# Patient Record
Sex: Female | Born: 1995 | Race: Black or African American | Hispanic: No | Marital: Single | Smoking: Never smoker
Health system: Southern US, Community
[De-identification: ages and names within clinical notes are randomized; demographics above are authoritative.]

## PROBLEM LIST (undated history)

## (undated) DIAGNOSIS — L0291 Cutaneous abscess, unspecified: Secondary | ICD-10-CM

---
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
## 2015-07-30 ENCOUNTER — Emergency Department (HOSPITAL_COMMUNITY)
Admission: EM | Admit: 2015-07-30 | Discharge: 2015-07-30 | Disposition: A | Discharge status: Home / Self Care | Payer: Medicaid Other | Attending: Emergency Medicine | Admitting: Emergency Medicine

## 2015-07-30 ENCOUNTER — Encounter (HOSPITAL_COMMUNITY): Payer: Self-pay | Admitting: Emergency Medicine

## 2015-07-30 DIAGNOSIS — K6289 Other specified diseases of anus and rectum: Secondary | ICD-10-CM

## 2015-07-30 DIAGNOSIS — N939 Abnormal uterine and vaginal bleeding, unspecified: Secondary | ICD-10-CM

## 2015-07-30 DIAGNOSIS — N611 Abscess of the breast and nipple: Secondary | ICD-10-CM | POA: Insufficient documentation

## 2015-07-30 DIAGNOSIS — Z79899 Other long term (current) drug therapy: Secondary | ICD-10-CM | POA: Insufficient documentation

## 2015-07-30 LAB — POC URINE PREG, ED: PREG TEST UR: NEGATIVE

## 2015-07-30 MED ORDER — SULFAMETHOXAZOLE-TRIMETHOPRIM 800-160 MG PO TABS: 1.0000 | ORAL_TABLET | Freq: Two times a day (BID) | ORAL | Status: AC

## 2015-07-30 MED ORDER — SULFAMETHOXAZOLE-TRIMETHOPRIM 800-160 MG PO TABS
1.0000 | ORAL_TABLET | Freq: Once | ORAL | Status: AC
  Administered 2015-07-30: 1 via ORAL
  Filled 2015-07-30: qty 1

## 2015-07-30 MED ORDER — LIDOCAINE-EPINEPHRINE (PF) 2 %-1:200000 IJ SOLN
20.0000 mL | Freq: Once | INTRAMUSCULAR | Status: AC
  Administered 2015-07-30: 20 mL
  Filled 2015-07-30: qty 20

## 2015-07-30 NOTE — ED Provider Notes (Signed)
CSN: [id number]     Arrival date & time 07/30/15  [id number] History   First MD Initiated Contact with Patient 07/30/15 [phone]     Chief Complaint  Patient presents with  . Breast Problem  . Vaginal Bleeding      HPI Patient presents the emergency department today with 3 complaints  First complaint is that she has a history of hidradenitis reports feeling an abscess under her right breast without fevers or chills.  She states this area has enlarged and she is concerned it may need to be drained.  Her second complaint is that she is has small amount of pain with defecation she's concerned that she could have a hemorrhoid.  She denies rectal bleeding.  She reports trying Preparation H without improvement in her symptoms  Her third complaint is that she has an Implanon placed several months ago and continues to have vaginal spotting.  She denies lower abdominal pain or cramping.  No fevers or chills.     Past Medical History  Diagnosis Date  . Abscess    History reviewed. No pertinent past surgical history. History reviewed. No pertinent family history. Social History  Substance Use Topics  . Smoking status: Never Smoker   . Smokeless tobacco: None  . Alcohol Use: No   OB History    No data available     Review of Systems  All other systems reviewed and are negative.     Allergies  Review of patient's allergies indicates no known allergies.  Home Medications   Prior to Admission medications   Medication Sig Start Date End Date Taking? Authorizing Provider  etonogestrel (NEXPLANON) 68 MG IMPL implant 1 each by Subdermal route once. Patient received in April   Yes Historical Provider, MD  ibuprofen (ADVIL,MOTRIN) 200 MG tablet Take 400 mg by mouth every 8 (eight) hours as needed for headache or moderate pain.    Yes Historical Provider, MD  Multiple Vitamins-Minerals (HAIR SKIN NAILS PO) Take 3 tablets by mouth daily.   Yes Historical Provider, MD  minocycline  (MINOCIN,DYNACIN) 100 MG capsule Take 1 capsule (100 mg total) by mouth 2 (two) times daily. Patient not taking: Reported on 07/30/2015 03/09/14   Linna HoffJames D Kindl, MD   BP 119/79 mmHg  Pulse 71  Temp(Src) 99.1 F (37.3 C) (Oral)  Resp 14  Ht 5\' 6"  (1.676 m)  Wt 205 lb (92.987 kg)  BMI 33.10 kg/m2  SpO2 97% Physical Exam  Constitutional: She is oriented to person, place, and time. She appears well-developed and well-nourished.  HENT:  Head: Normocephalic.  Eyes: EOM are normal.  Neck: Normal range of motion.  Cardiovascular: Normal rate.   Pulmonary/Chest: Effort normal.  Small abscess of right lower breast at 6:00 near the anterior chest wall without surrounding erythema.  No drainage.  Definite fluctuance  Abdominal: She exhibits no distension.  Genitourinary:  Normal-appearing rectum.  No obvious hemorrhoids.  Musculoskeletal: Normal range of motion.  Neurological: She is alert and oriented to person, place, and time.  Psychiatric: She has a normal mood and affect.  Nursing note and vitals reviewed.   ED Course  .Marland Kitchen.Incision and Drainage Date/Time: 07/30/2015 11:51 AM Performed by: Azalia BilisAMPOS, Urban [place] Authorized by: Azalia BilisAMPOS, Ericberto Padget    INCISION AND DRAINAGE Performed by: Lyanne CoAMPOS,Piotr Christopher M Consent: Verbal consent obtained. Risks and benefits: risks, benefits and alternatives were discussed Time out performed prior to procedure Type: abscess Body area: right breast Anesthesia: local infiltration Incision was made with a scalpel. Local anesthetic: lidocaine  2% with epinephrine Anesthetic total: 3 ml Complexity: complex Blunt dissection to break up loculations Drainage: purulent Drainage amount: moderate Packing material: none Patient tolerance: Patient tolerated the procedure well with no immediate complications.     Labs Review Labs Reviewed  POC URINE PREG, ED    Imaging Review No results found. I have personally reviewed and evaluated these images and lab results as  part of my medical decision-making.   EKG Interpretation None      MDM   Final diagnoses:  None    Incision and drainage performed without difficulty.  Purulent material removed.  Patient be placed on antibiotics.  In regards to her vaginal spotting outpatient GYN follow-up.  Pregnancy test is negative.  In regards to her discomfort with bowel movement she has no obvious hemorrhoids or bleeding at this time.  Outpatient GI follow-up.    Azalia Bilis, MD 07/30/15 1154

## 2015-07-30 NOTE — ED Notes (Signed)
Pt reports swelling underneath bilateral breasts, groin and between buttocks. Also reports pain with defecation that improved after using preparation H. Pt also began to have spotting after the end of April. Had birth control implant placed in arm at the beginning of April.

## 2017-11-09 ENCOUNTER — Ambulatory Visit (HOSPITAL_COMMUNITY)
Admission: EM | Admit: 2017-11-09 | Discharge: 2017-11-09 | Disposition: A | Discharge status: Home / Self Care | Payer: BC Managed Care – PPO | Attending: Family Medicine | Admitting: Family Medicine

## 2017-11-09 ENCOUNTER — Encounter (HOSPITAL_COMMUNITY): Payer: Self-pay | Admitting: Emergency Medicine

## 2017-11-09 DIAGNOSIS — G44209 Tension-type headache, unspecified, not intractable: Secondary | ICD-10-CM | POA: Diagnosis not present

## 2017-11-09 MED ORDER — DEXAMETHASONE SODIUM PHOSPHATE 10 MG/ML IJ SOLN
10.0000 mg | Freq: Once | INTRAMUSCULAR | Status: AC
  Administered 2017-11-09: 10 mg via INTRAMUSCULAR

## 2017-11-09 MED ORDER — KETOROLAC TROMETHAMINE 60 MG/2ML IM SOLN
60.0000 mg | Freq: Once | INTRAMUSCULAR | Status: AC
  Administered 2017-11-09: 60 mg via INTRAMUSCULAR

## 2017-11-09 MED ORDER — IBUPROFEN 800 MG PO TABS: 800.0000 mg | ORAL_TABLET | Freq: Three times a day (TID) | ORAL | 0 refills | Status: DC

## 2017-11-09 MED ORDER — KETOROLAC TROMETHAMINE 60 MG/2ML IM SOLN
INTRAMUSCULAR | Status: AC
  Filled 2017-11-09: qty 2

## 2017-11-09 MED ORDER — DEXAMETHASONE SODIUM PHOSPHATE 10 MG/ML IJ SOLN
INTRAMUSCULAR | Status: AC
  Filled 2017-11-09: qty 1

## 2017-11-09 NOTE — ED Triage Notes (Signed)
Pt here for frontal HA x 3 days 

## 2017-11-09 NOTE — Discharge Instructions (Signed)
Use anti-inflammatories for pain/swelling. You may take up to 800 mg Ibuprofen every 8 hours with food. You may supplement Ibuprofen with Tylenol [phone] mg every 8 hours.   Please return here go to emergency room if developing worsening headache, persistent headache, developing changes in vision, nausea, vomiting, dizziness, lightheadedness, passing out.

## 2017-11-10 ENCOUNTER — Other Ambulatory Visit: Payer: Self-pay

## 2017-11-10 ENCOUNTER — Emergency Department (HOSPITAL_COMMUNITY)
Admission: EM | Admit: 2017-11-10 | Discharge: 2017-11-10 | Disposition: A | Discharge status: Home / Self Care | Payer: BC Managed Care – PPO | Attending: Emergency Medicine | Admitting: Emergency Medicine

## 2017-11-10 ENCOUNTER — Encounter (HOSPITAL_COMMUNITY): Payer: Self-pay

## 2017-11-10 DIAGNOSIS — Y998 Other external cause status: Secondary | ICD-10-CM | POA: Insufficient documentation

## 2017-11-10 DIAGNOSIS — X58XXXA Exposure to other specified factors, initial encounter: Secondary | ICD-10-CM | POA: Insufficient documentation

## 2017-11-10 DIAGNOSIS — Z79899 Other long term (current) drug therapy: Secondary | ICD-10-CM | POA: Diagnosis not present

## 2017-11-10 DIAGNOSIS — Y9389 Activity, other specified: Secondary | ICD-10-CM | POA: Insufficient documentation

## 2017-11-10 DIAGNOSIS — R519 Headache, unspecified: Secondary | ICD-10-CM

## 2017-11-10 DIAGNOSIS — R51 Headache: Secondary | ICD-10-CM | POA: Insufficient documentation

## 2017-11-10 DIAGNOSIS — Y929 Unspecified place or not applicable: Secondary | ICD-10-CM | POA: Insufficient documentation

## 2017-11-10 DIAGNOSIS — S00451A Superficial foreign body of right ear, initial encounter: Secondary | ICD-10-CM | POA: Diagnosis present

## 2017-11-10 MED ORDER — KETOROLAC TROMETHAMINE 30 MG/ML IJ SOLN
30.0000 mg | Freq: Once | INTRAMUSCULAR | Status: AC
  Administered 2017-11-10: 30 mg via INTRAVENOUS
  Filled 2017-11-10: qty 1

## 2017-11-10 MED ORDER — SODIUM CHLORIDE 0.9 % IV BOLUS
1000.0000 mL | Freq: Once | INTRAVENOUS | Status: AC
  Administered 2017-11-10: 1000 mL via INTRAVENOUS

## 2017-11-10 MED ORDER — DEXAMETHASONE SODIUM PHOSPHATE 10 MG/ML IJ SOLN
10.0000 mg | Freq: Once | INTRAMUSCULAR | Status: AC
  Administered 2017-11-10: 10 mg via INTRAVENOUS
  Filled 2017-11-10: qty 1

## 2017-11-10 MED ORDER — FLUTICASONE PROPIONATE 50 MCG/ACT NA SUSP: 1.0000 | Freq: Every day | NASAL | 2 refills | Status: DC

## 2017-11-10 MED ORDER — AMOXICILLIN-POT CLAVULANATE 875-125 MG PO TABS: 1.0000 | ORAL_TABLET | Freq: Two times a day (BID) | ORAL | 0 refills | Status: DC

## 2017-11-10 MED ORDER — BUTALBITAL-APAP-CAFFEINE 50-325-40 MG PO TABS: 1.0000 | ORAL_TABLET | Freq: Four times a day (QID) | ORAL | 0 refills | Status: DC | PRN

## 2017-11-10 MED ORDER — LORATADINE 10 MG PO TABS: 10.0000 mg | ORAL_TABLET | Freq: Every day | ORAL | 0 refills | Status: DC

## 2017-11-10 MED ORDER — PROCHLORPERAZINE EDISYLATE 10 MG/2ML IJ SOLN
10.0000 mg | Freq: Once | INTRAMUSCULAR | Status: AC
  Administered 2017-11-10: 10 mg via INTRAVENOUS
  Filled 2017-11-10: qty 2

## 2017-11-10 NOTE — ED Provider Notes (Signed)
[facility]    CSN: [id number] Arrival date & time: 11/09/17  1026     History   Chief Complaint Chief Complaint  Patient presents with  . Headache    HPI Melissa Fisher is a 22 y.o. female no contributing past medical history presenting today for evaluation of a headache.  Patient states that for the past 2 to 3 days she has had an elastic band sensation around her head.  Onset gradual.  Headache typically worsens throughout the day.  Feels a pressure sensation especially with looking downward.  Also notes that she has had a toothache in her right lower jaw for the past couple days, but this is eased off today.  Denies issues with this tooth.  Denies vision changes with headache.  Denies associated nausea, vomiting, photophobia, phonophobia, dizziness or lightheadedness.  Denies weakness.  Has been using Aleve with mild relief.  HPI  Past Medical History:  Diagnosis Date  . Abscess     Patient Active Problem List   Diagnosis Date Noted  . Hidradenitis suppurativa of right axilla 04/05/2012    History reviewed. No pertinent surgical history.  OB History   None      Home Medications    Prior to Admission medications   Medication Sig Start Date End Date Taking? Authorizing Provider  etonogestrel (NEXPLANON) 68 MG IMPL implant 1 each by Subdermal route once. Patient received in April    [provider]  ibuprofen (ADVIL,MOTRIN) 800 MG tablet Take 1 tablet (800 mg total) by mouth 3 (three) times daily. 11/09/17   Eulis Salazar C, PA-C  Multiple Vitamins-Minerals (HAIR SKIN NAILS PO) Take 3 tablets by mouth daily.    [provider]    Family History History reviewed. No pertinent family history.  Social History Social History   Tobacco Use  . Smoking status: Never Smoker  Substance Use Topics  . Alcohol use: No  . Drug use: No     Allergies   Patient has no known allergies.   Review of Systems Review of Systems    Constitutional: Negative for fatigue and fever.  HENT: Positive for dental problem. Negative for congestion, sinus pressure and sore throat.   Eyes: Negative for photophobia, pain and visual disturbance.  Respiratory: Negative for cough and shortness of breath.   Cardiovascular: Negative for chest pain.  Gastrointestinal: Negative for abdominal pain, nausea and vomiting.  Genitourinary: Negative for decreased urine volume and hematuria.  Musculoskeletal: Negative for myalgias, neck pain and neck stiffness.  Neurological: Positive for headaches. Negative for dizziness, syncope, facial asymmetry, speech difficulty, weakness, light-headedness and numbness.     Physical Exam Triage Vital Signs ED Triage Vitals [11/09/17 1055]  Enc Vitals Group     BP 126/71     Pulse Rate 81     Resp 18     Temp 98.2 F (36.8 C)     Temp Source Oral     SpO2 100 %     Weight      Height      Head Circumference      Peak Flow      Pain Score      Pain Loc      Pain Edu?      Excl. in GC?    No data found.  Updated Vital Signs BP 126/71 (BP Location: Right Arm)   Pulse 81   Temp 98.2 F (36.8 C) (Oral)   Resp 18   SpO2 100%  Visual Acuity Right Eye Distance:   Left Eye Distance:   Bilateral Distance:    Right Eye Near:   Left Eye Near:    Bilateral Near:     Physical Exam  Constitutional: She is oriented to person, place, and time. She appears well-developed and well-nourished. No distress.  HENT:  Head: Normocephalic and atraumatic.  Mouth/Throat: Oropharynx is clear and moist.  Bilateral ears without tenderness to palpation of external auricle, tragus and mastoid, EAC's without erythema or swelling, TM's with good bony landmarks and cone of light. Non erythematous.  Oral mucosa pink and moist, no tonsillar enlargement or exudate. Posterior pharynx patent and nonerythematous, no uvula deviation or swelling. Normal phonation.  Eyes: Pupils are equal, round, and reactive to  light. Conjunctivae and EOM are normal.  Neck: Normal range of motion. Neck supple.  Cardiovascular: Normal rate and regular rhythm.  No murmur heard. Pulmonary/Chest: Effort normal and breath sounds normal. No respiratory distress.  Breathing comfortably at rest, CTABL, no wheezing, rales or other adventitious sounds auscultated  Abdominal: Soft. There is no tenderness.  Musculoskeletal: She exhibits no edema.  Neurological: She is alert and oriented to person, place, and time.  Patient A&O x3, cranial nerves II-XII grossly intact, strength at shoulders, hips and knees 5/5, equal bilaterally, patellar reflex 2+ bilaterally.  Gait without abnormality.   Skin: Skin is warm and dry.  Psychiatric: She has a normal mood and affect.  Nursing note and vitals reviewed.    UC Treatments / Results  Labs (all labs ordered are listed, but only abnormal results are displayed) Labs Reviewed - No data to display  EKG None  Radiology No results found.  Procedures Procedures (including critical care time)  Medications Ordered in UC Medications  ketorolac (TORADOL) injection 60 mg (60 mg Intramuscular Given 11/09/17 1143)  dexamethasone (DECADRON) injection 10 mg (10 mg Intramuscular Given 11/09/17 1143)    Initial Impression / Assessment and Plan / UC Course  I have reviewed the triage vital signs and the nursing notes.  Pertinent labs & imaging results that were available during my care of the patient were reviewed by me and considered in my medical decision making (see chart for details).     Patient with headache, likely tension type, no focal neuro deficits, vital signs stable, recommended patient to receive Toradol and Decadron, will send home with ibuprofen 800 for further management of headache at home along with taking Tylenol.Discussed strict return precautions. Patient verbalized understanding and is agreeable with plan.  Final Clinical Impressions(s) / UC Diagnoses   Final  diagnoses:  Acute non intractable tension-type headache     Discharge Instructions     Use anti-inflammatories for pain/swelling. You may take up to 800 mg Ibuprofen every 8 hours with food. You may supplement Ibuprofen with Tylenol [phone] mg every 8 hours.   Please return here go to emergency room if developing worsening headache, persistent headache, developing changes in vision, nausea, vomiting, dizziness, lightheadedness, passing out.   ED Prescriptions    Medication Sig Dispense Auth. Provider   ibuprofen (ADVIL,MOTRIN) 800 MG tablet Take 1 tablet (800 mg total) by mouth 3 (three) times daily. 21 tablet Deshunda Thackston, [place] C, PA-C     Controlled Substance Prescriptions [place] Controlled Substance Registry consulted? Not Applicable   Lew Dawes, [place] 11/10/17 7371

## 2017-11-10 NOTE — ED Notes (Signed)
Patient verbalizes understanding of discharge instructions. Opportunity for questioning and answers were provided. Armband removed by staff, pt discharged from ED.  

## 2017-11-10 NOTE — ED Provider Notes (Addendum)
[facility] EMERGENCY DEPARTMENT Provider Note   CSN: [id number] Arrival date & time: 11/10/17  1327     History   Chief Complaint Chief Complaint  Patient presents with  . Headache    HPI   Blood pressure 124/72, pulse 72, temperature 99.2 F (37.3 C), temperature source Oral, resp. rate 17, height 5\' 6"  (1.676 m), weight 93 kg, last menstrual period 11/04/2017, SpO2 100 %.  Melissa Fisher is a 22 y.o. female complaining of intermittent bilateral frontal headache onset 4 days ago she does not typically have headaches exacerbated by loud sounds and light.  She was seen at the urgent care yesterday she got 2 shots with little relief.  There is no associated fever, chills, neck stiffness, nausea vomiting, change in vision. Pt denies fever, rash, confusion, cervicalgia, LOC/syncope, change in vision, N/V, numbness, weakness, dysarthria, ataxia, thunderclap onset, exacerbation with exertion or valsalva, exacerbation in morning, CP, SOB, abdominal pain.   Past Medical History:  Diagnosis Date  . Abscess     Patient Active Problem List   Diagnosis Date Noted  . Hidradenitis suppurativa of right axilla 04/05/2012    History reviewed. No pertinent surgical history.   OB History   None      Home Medications    Prior to Admission medications   Medication Sig Start Date End Date Taking? Authorizing Provider  amoxicillin-clavulanate (AUGMENTIN) 875-125 MG tablet Take 1 tablet by mouth every 12 (twelve) hours. 11/10/17   Genia Perin, Joni Reining, PA-C  butalbital-acetaminophen-caffeine (FIORICET, ESGIC) [phone] MG tablet Take 1 tablet by mouth every 6 (six) hours as needed for headache. 11/10/17   Demarius Archila, Joni Reining, PA-C  etonogestrel (NEXPLANON) 68 MG IMPL implant 1 each by Subdermal route once. Patient received in April    [provider]  fluticasone [facility]) 50 MCG/ACT nasal spray Place 1 spray into both nostrils daily. 11/10/17   Citlally Captain, Joni Reining, PA-C    ibuprofen (ADVIL,MOTRIN) 800 MG tablet Take 1 tablet (800 mg total) by mouth 3 (three) times daily. 11/09/17   Wieters, Hallie C, PA-C  loratadine (CLARITIN) 10 MG tablet Take 1 tablet (10 mg total) by mouth daily. One po daily x 5 days 11/10/17   Latishia Suitt, Joni Reining, PA-C  Multiple Vitamins-Minerals (HAIR SKIN NAILS PO) Take 3 tablets by mouth daily.    [provider]    Family History No family history on file.  Social History Social History   Tobacco Use  . Smoking status: Never Smoker  Substance Use Topics  . Alcohol use: No  . Drug use: No     Allergies   Patient has no known allergies.   Review of Systems Review of Systems   A complete review of systems was obtained and all systems are negative except as noted in the HPI and PMH.    Physical Exam Updated Vital Signs BP 102/76   Pulse 67   Temp 99.2 F (37.3 C) (Oral)   Resp 17   Ht 5\' 6"  (1.676 m)   Wt 93 kg   LMP 11/04/2017 (Exact Date)   SpO2 100%   BMI 33.09 kg/m   Physical Exam  Constitutional: She is oriented to person, place, and time. She appears well-developed and well-nourished.  HENT:  Head: Normocephalic and atraumatic.  Mouth/Throat: Oropharynx is clear and moist.  Right ear canal with foreign body  Eyes: Pupils are equal, round, and reactive to light. Conjunctivae and EOM are normal.  No TTP of maxillary or frontal sinuses  No TTP  or induration of temporal arteries bilaterally  Neck: Normal range of motion. Neck supple.  FROM to C-spine. Pt can touch chin to chest without discomfort. No TTP of midline cervical spine.   Cardiovascular: Normal rate, regular rhythm and intact distal pulses.  Pulmonary/Chest: Effort normal and breath sounds normal. No respiratory distress. She has no wheezes. She has no rales. She exhibits no tenderness.  Abdominal: Soft. Bowel sounds are normal. There is no tenderness.  Musculoskeletal: Normal range of motion. She exhibits no edema or tenderness.   Neurological: She is alert and oriented to person, place, and time. No cranial nerve deficit.  II-Visual fields grossly intact. III/IV/VI-Extraocular movements intact.  Pupils reactive bilaterally. V/VII-Smile symmetric, equal eyebrow raise,  facial sensation intact VIII- Hearing grossly intact IX/X-Normal gag XI-bilateral shoulder shrug XII-midline tongue extension Motor: 5/5 bilaterally with normal tone and bulk Cerebellar: Normal finger-to-nose  and normal heel-to-shin test.   Romberg negative Ambulates with a coordinated gait   Nursing note and vitals reviewed.    ED Treatments / Results  Labs (all labs ordered are listed, but only abnormal results are displayed) Labs Reviewed - No data to display  EKG None  Radiology No results found.  Procedures .Foreign Body Removal Date/Time: 11/10/2017 3:03 PM Performed by: Wynetta Emery, PA-C Authorized by: Wynetta Emery, PA-C  Consent: Verbal consent obtained. Consent given by: patient Patient understanding: patient does not state understanding of the procedure being performed Body area: ear  Sedation: Patient sedated: no  Localization method: probed Removal mechanism: irrigation and alligator forceps Complexity: simple 1 objects recovered. Objects recovered: q-tip cotton Post-procedure assessment: foreign body removed Patient tolerance: Patient tolerated the procedure well with no immediate complications   (including critical care time)  Medications Ordered in ED Medications  sodium chloride 0.9 % bolus 1,000 mL (1,000 mLs Intravenous New Bag/Given 11/10/17 1521)  prochlorperazine (COMPAZINE) injection 10 mg (10 mg Intravenous Given 11/10/17 1520)  dexamethasone (DECADRON) injection 10 mg (10 mg Intravenous Given 11/10/17 1520)  ketorolac (TORADOL) 30 MG/ML injection 30 mg (30 mg Intravenous Given 11/10/17 1520)     Initial Impression / Assessment and Plan / ED Course  I have reviewed the triage vital signs and  the nursing notes.  Pertinent labs & imaging results that were available during my care of the patient were reviewed by me and considered in my medical decision making (see chart for details).     Vitals:   11/10/17 1415 11/10/17 1430 11/10/17 1445 11/10/17 1500  BP: (!) 88/78 115/70 109/74 102/76  Pulse: 68 73 75 67  Resp:      Temp:      TempSrc:      SpO2: 100% 100% 100% 100%  Weight:      Height:        Medications  sodium chloride 0.9 % bolus 1,000 mL (1,000 mLs Intravenous New Bag/Given 11/10/17 1521)  prochlorperazine (COMPAZINE) injection 10 mg (10 mg Intravenous Given 11/10/17 1520)  dexamethasone (DECADRON) injection 10 mg (10 mg Intravenous Given 11/10/17 1520)  ketorolac (TORADOL) 30 MG/ML injection 30 mg (30 mg Intravenous Given 11/10/17 1520)    Melissa Fisher is 22 y.o. female presenting with persistent frontal headache over the course of the last 4 days, she states is worse leaning forward.  She was seen in urgent care yesterday given 2 shots without relief.  She also notes that she has a foreign body in the right outer ear canal.  This is not been bothering her.  She does use  Q-tips in the ear.  In shared decision making we have discussed the pros and cons of obtaining CAT scan imaging.  I do not think this is warranted at this time given her reassuring neurologic exam, history with no red flags and 4 days of headache.  I think this might be a sinus infection versus tension headache.  I think is reasonable to start on a course of antibiotics and then reassess for imaging, she understands she can return to ED at any time, foreign body was removed from right ear without complication.  Advised her not to use Q-tips in the outer ear canal.  Proved with headache cocktail, will treat for sinusitis, advised her to return for any worsening symptoms, get eye exam and establish primary care.  Evaluation does not show pathology that would require ongoing emergent intervention or  inpatient treatment. Pt is hemodynamically stable and mentating appropriately. Discussed findings and plan with patient/guardian, who agrees with care plan. All questions answered. Return precautions discussed and outpatient follow up given.    Final Clinical Impressions(s) / ED Diagnoses   Final diagnoses:  Foreign body of external ear, right, initial encounter  Frontal headache    ED Discharge Orders         Ordered    amoxicillin-clavulanate (AUGMENTIN) 875-125 MG tablet  Every 12 hours     11/10/17 1500    butalbital-acetaminophen-caffeine (FIORICET, ESGIC) 50-325-40 MG tablet  Every 6 hours PRN     11/10/17 1500    fluticasone (FLONASE) 50 MCG/ACT nasal spray  Daily     11/10/17 1605    loratadine (CLARITIN) 10 MG tablet  Daily     11/10/17 1605           Anaria Kroner, Mardella Layman 11/10/17 1503    Quince Santana, [place], PA-C 11/10/17 1607    Tilden Fossa, MD 11/11/17 [phone]

## 2017-11-10 NOTE — ED Triage Notes (Signed)
Patient prevents for evaluation of ongoing headache x 4 days. States was seen at [facility] yesterday for same, given two injections for pain. States neither helped. Pain is constant, describes as band around head. Patient states she was told at [facility] that she had a piece of cotton in R ear but they did not have tools to remove.

## 2017-11-10 NOTE — Discharge Instructions (Signed)
Please follow with your primary care doctor in the next 2 days for a check-up. They must obtain records for further management.  ° °Do not hesitate to return to the Emergency Department for any new, worsening or concerning symptoms.  ° °

## 2019-01-24 ENCOUNTER — Other Ambulatory Visit: Payer: Self-pay

## 2019-01-24 DIAGNOSIS — Z20822 Contact with and (suspected) exposure to covid-19: Secondary | ICD-10-CM

## 2019-01-27 LAB — NOVEL CORONAVIRUS, NAA: SARS-CoV-2, NAA: NOT DETECTED

## 2019-03-17 ENCOUNTER — Encounter: Payer: Self-pay | Admitting: Family Medicine

## 2019-03-17 ENCOUNTER — Ambulatory Visit (INDEPENDENT_AMBULATORY_CARE_PROVIDER_SITE_OTHER): Payer: 59 | Admitting: Family Medicine

## 2019-03-17 ENCOUNTER — Other Ambulatory Visit: Payer: Self-pay

## 2019-03-17 VITALS — BP 118/80 | HR 92 | Temp 99.2°F | Ht 65.0 in | Wt 209.8 lb

## 2019-03-17 DIAGNOSIS — Z23 Encounter for immunization: Secondary | ICD-10-CM | POA: Diagnosis not present

## 2019-03-17 DIAGNOSIS — Z0000 Encounter for general adult medical examination without abnormal findings: Secondary | ICD-10-CM

## 2019-03-17 NOTE — Patient Instructions (Signed)
Health Maintenance, Female Adopting a healthy lifestyle and getting preventive care are important in promoting health and wellness. Ask your health care provider about:  The right schedule for you to have regular tests and exams.  Things you can do on your own to prevent diseases and keep yourself healthy. What should I know about diet, weight, and exercise? Eat a healthy diet   Eat a diet that includes plenty of vegetables, fruits, low-fat dairy products, and lean protein.  Do not eat a lot of foods that are high in solid fats, added sugars, or sodium. Maintain a healthy weight Body mass index (BMI) is used to identify weight problems. It estimates body fat based on height and weight. Your health care provider can help determine your BMI and help you achieve or maintain a healthy weight. Get regular exercise Get regular exercise. This is one of the most important things you can do for your health. Most adults should:  Exercise for at least 150 minutes each week. The exercise should increase your heart rate and make you sweat (moderate-intensity exercise).  Do strengthening exercises at least twice a week. This is in addition to the moderate-intensity exercise.  Spend less time sitting. Even light physical activity can be beneficial. Watch cholesterol and blood lipids Have your blood tested for lipids and cholesterol at 24 years of age, then have this test every 5 years. Have your cholesterol levels checked more often if:  Your lipid or cholesterol levels are high.  You are older than 24 years of age.  You are at high risk for heart disease. What should I know about cancer screening? Depending on your health history and family history, you may need to have cancer screening at various ages. This may include screening for:  Breast cancer.  Cervical cancer.  Colorectal cancer.  Skin cancer.  Lung cancer. What should I know about heart disease, diabetes, and high blood  pressure? Blood pressure and heart disease  High blood pressure causes heart disease and increases the risk of stroke. This is more likely to develop in people who have high blood pressure readings, are of African descent, or are overweight.  Have your blood pressure checked: ? Every 3-5 years if you are 54-62 years of age. ? Every year if you are 93 years old or older. Diabetes Have regular diabetes screenings. This checks your fasting blood sugar level. Have the screening done:  Once every three years after age 69 if you are at a normal weight and have a low risk for diabetes.  More often and at a younger age if you are overweight or have a high risk for diabetes. What should I know about preventing infection? Hepatitis B If you have a higher risk for hepatitis B, you should be screened for this virus. Talk with your health care provider to find out if you are at risk for hepatitis B infection. Hepatitis C Testing is recommended for:  Everyone born from [birth year] through 1965.  Anyone with known risk factors for hepatitis C. Sexually transmitted infections (STIs)  Get screened for STIs, including gonorrhea and chlamydia, if: ? You are sexually active and are younger than 24 years of age. ? You are older than 24 years of age and your health care provider tells you that you are at risk for this type of infection. ? Your sexual activity has changed since you were last screened, and you are at increased risk for chlamydia or gonorrhea. Ask your health care provider if  you are at risk.  Ask your health care provider about whether you are at high risk for HIV. Your health care provider may recommend a prescription medicine to help prevent HIV infection. If you choose to take medicine to prevent HIV, you should first get tested for HIV. You should then be tested every 3 months for as long as you are taking the medicine. Pregnancy  If you are about to stop having your period (premenopausal) and  you may become pregnant, seek counseling before you get pregnant.  Take 400 to 800 micrograms (mcg) of folic acid every day if you become pregnant.  Ask for birth control (contraception) if you want to prevent pregnancy. Osteoporosis and menopause Osteoporosis is a disease in which the bones lose minerals and strength with aging. This can result in bone fractures. If you are 9 years old or older, or if you are at risk for osteoporosis and fractures, ask your health care provider if you should:  Be screened for bone loss.  Take a calcium or vitamin D supplement to lower your risk of fractures.  Be given hormone replacement therapy (HRT) to treat symptoms of menopause. Follow these instructions at home: Lifestyle  Do not use any products that contain nicotine or tobacco, such as cigarettes, e-cigarettes, and chewing tobacco. If you need help quitting, ask your health care provider.  Do not use street drugs.  Do not share needles.  Ask your health care provider for help if you need support or information about quitting drugs. Alcohol use  Do not drink alcohol if: ? Your health care provider tells you not to drink. ? You are pregnant, may be pregnant, or are planning to become pregnant.  If you drink alcohol: ? Limit how much you use to 0-1 drink a day. ? Limit intake if you are breastfeeding.  Be aware of how much alcohol is in your drink. In the [place] one drink equals one 12 oz bottle of beer (355 mL), one 5 oz glass of wine (148 mL), or one 1 oz glass of hard liquor (44 mL). General instructions  Schedule regular health, dental, and eye exams.  Stay current with your vaccines.  Tell your health care provider if: ? You often feel depressed. ? You have ever been abused or do not feel safe at home. Summary  Adopting a healthy lifestyle and getting preventive care are important in promoting health and wellness.  Follow your health care provider's instructions about healthy  diet, exercising, and getting tested or screened for diseases.  Follow your health care provider's instructions on monitoring your cholesterol and blood pressure. This information is not intended to replace advice given to you by your health care provider. Make sure you discuss any questions you have with your health care provider. Document Revised: 02/13/2018 Document Reviewed: 02/13/2018 Elsevier Patient Education  2020 [place] 93-24 Years Old, Female Preventive care refers to visits with your health care provider and lifestyle choices that can promote health and wellness. This includes:  A yearly physical exam. This may also be called an annual well check.  Regular dental visits and eye exams.  Immunizations.  Screening for certain conditions.  Healthy lifestyle choices, such as eating a healthy diet, getting regular exercise, not using drugs or products that contain nicotine and tobacco, and limiting alcohol use. What can I expect for my preventive care visit? Physical exam Your health care provider will check your:  Height and weight. This may be used  to calculate body mass index (BMI), which tells if you are at a healthy weight.  Heart rate and blood pressure.  Skin for abnormal spots. Counseling Your health care provider may ask you questions about your:  Alcohol, tobacco, and drug use.  Emotional well-being.  Home and relationship well-being.  Sexual activity.  Eating habits.  Work and work Statistician.  Method of birth control.  Menstrual cycle.  Pregnancy history. What immunizations do I need?  Influenza (flu) vaccine  This is recommended every year. Tetanus, diphtheria, and pertussis (Tdap) vaccine  You may need a Td booster every 10 years. Varicella (chickenpox) vaccine  You may need this if you have not been vaccinated. Human papillomavirus (HPV) vaccine  If recommended by your health care provider, you may need three  doses over 6 months. Measles, mumps, and rubella (MMR) vaccine  You may need at least one dose of MMR. You may also need a second dose. Meningococcal conjugate (MenACWY) vaccine  One dose is recommended if you are age 46-21 years and a first-year college student living in a residence hall, or if you have one of several medical conditions. You may also need additional booster doses. Pneumococcal conjugate (PCV13) vaccine  You may need this if you have certain conditions and were not previously vaccinated. Pneumococcal polysaccharide (PPSV23) vaccine  You may need one or two doses if you smoke cigarettes or if you have certain conditions. Hepatitis A vaccine  You may need this if you have certain conditions or if you travel or work in places where you may be exposed to hepatitis A. Hepatitis B vaccine  You may need this if you have certain conditions or if you travel or work in places where you may be exposed to hepatitis B. Haemophilus influenzae type b (Hib) vaccine  You may need this if you have certain conditions. You may receive vaccines as individual doses or as more than one vaccine together in one shot (combination vaccines). Talk with your health care provider about the risks and benefits of combination vaccines. What tests do I need?  Blood tests  Lipid and cholesterol levels. These may be checked every 5 years starting at age 12.  Hepatitis C test.  Hepatitis B test. Screening  Diabetes screening. This is done by checking your blood sugar (glucose) after you have not eaten for a while (fasting).  Sexually transmitted disease (STD) testing.  BRCA-related cancer screening. This may be done if you have a family history of breast, ovarian, tubal, or peritoneal cancers.  Pelvic exam and Pap test. This may be done every 3 years starting at age 28. Starting at age 37, this may be done every 5 years if you have a Pap test in combination with an HPV test. Talk with your  health care provider about your test results, treatment options, and if necessary, the need for more tests. Follow these instructions at home: Eating and drinking   Eat a diet that includes fresh fruits and vegetables, whole grains, lean protein, and low-fat dairy.  Take vitamin and mineral supplements as recommended by your health care provider.  Do not drink alcohol if: ? Your health care provider tells you not to drink. ? You are pregnant, may be pregnant, or are planning to become pregnant.  If you drink alcohol: ? Limit how much you have to 0-1 drink a day. ? Be aware of how much alcohol is in your drink. In the [place] one drink equals one 12 oz bottle of beer (  355 mL), one 5 oz glass of wine (148 mL), or one 1 oz glass of hard liquor (44 mL). Lifestyle  Take daily care of your teeth and gums.  Stay active. Exercise for at least 30 minutes on 5 or more days each week.  Do not use any products that contain nicotine or tobacco, such as cigarettes, e-cigarettes, and chewing tobacco. If you need help quitting, ask your health care provider.  If you are sexually active, practice safe sex. Use a condom or other form of birth control (contraception) in order to prevent pregnancy and STIs (sexually transmitted infections). If you plan to become pregnant, see your health care provider for a preconception visit. What's next?  Visit your health care provider once a year for a well check visit.  Ask your health care provider how often you should have your eyes and teeth checked.  Stay up to date on all vaccines. This information is not intended to replace advice given to you by your health care provider. Make sure you discuss any questions you have with your health care provider. Document Revised: 11/01/2017 Document Reviewed: 11/01/2017 Elsevier Patient Education  2020 [employer] Inc[employer]  

## 2019-03-17 NOTE — Progress Notes (Signed)
New Patient Office Visit  Subjective:  Patient ID: Melissa Fisher, female    DOB: 03-04-1996  Age: 24 y.o. MRN: [id number]  CC:  Chief Complaint  Patient presents with  . Establish Care    CPE, no concerns     HPI Melissa Fisher presents for establishment of care and a physical exam.  She enjoys good health.  She exercises daily for 30 minutes.  She does not drink alcohol use illicit drugs or smoke.  She is currently working from home.  Dental care is planned for the near future.  Her dad is in his 34s and suffers from hypertension, early dementia and arthritis.  Her mom is 28 and is dealing with diabetes.  Patient is in search of a GYN provider.  She is currently using no form of contraception.  She has not had the Gardasil vaccination.  She has not had a Pap smear.  Past Medical History:  Diagnosis Date  . Abscess   . Hidradenitis suppurativa 2008    History reviewed. No pertinent surgical history.  Family History  Problem Relation Age of Onset  . Diabetes Mother   . Hypertension Father     Social History   Socioeconomic History  . Marital status: Single    Spouse name: Not on file  . Number of children: Not on file  . Years of education: Not on file  . Highest education level: Not on file  Occupational History  . Not on file  Tobacco Use  . Smoking status: Never Smoker  . Smokeless tobacco: Never Used  Substance and Sexual Activity  . Alcohol use: No  . Drug use: Not Currently    Types: Marijuana  . Sexual activity: Not Currently  Other Topics Concern  . Not on file  Social History Narrative  . Not on file   Social Determinants of Health   Financial Resource Strain:   . Difficulty of Paying Living Expenses: Not on file  Food Insecurity:   . Worried About Charity fundraiser in the Last Year: Not on file  . Ran Out of Food in the Last Year: Not on file  Transportation Needs:   . Lack of Transportation (Medical): Not on file  . Lack of  Transportation (Non-Medical): Not on file  Physical Activity:   . Days of Exercise per Week: Not on file  . Minutes of Exercise per Session: Not on file  Stress:   . Feeling of Stress : Not on file  Social Connections:   . Frequency of Communication with Friends and Family: Not on file  . Frequency of Social Gatherings with Friends and Family: Not on file  . Attends Religious Services: Not on file  . Active Member of Clubs or Organizations: Not on file  . Attends Archivist Meetings: Not on file  . Marital Status: Not on file  Intimate Partner Violence:   . Fear of Current or Ex-Partner: Not on file  . Emotionally Abused: Not on file  . Physically Abused: Not on file  . Sexually Abused: Not on file    ROS Review of Systems  Constitutional: Negative.   HENT: Negative.   Eyes: Negative.   Respiratory: Negative.   Cardiovascular: Negative.   Gastrointestinal: Negative.   Genitourinary: Negative.   Musculoskeletal: Negative.   Skin: Negative for pallor and rash.  Neurological: Negative.   Hematological: Negative.   Psychiatric/Behavioral: Negative.     Objective:   Today's Vitals: BP 118/80  Pulse 92   Temp 99.2 F (37.3 C) (Oral)   Ht 5' 5"  (1.651 m)   Wt 209 lb 12.8 oz (95.2 kg)   LMP 03/10/2019 (Approximate)   SpO2 100%   BMI 34.91 kg/m   Physical Exam Constitutional:      General: She is not in acute distress.    Appearance: Normal appearance. She is not ill-appearing, toxic-appearing or diaphoretic.  HENT:     Head: Normocephalic and atraumatic.     Right Ear: Tympanic membrane, ear canal and external ear normal. There is no impacted cerumen.     Left Ear: Tympanic membrane, ear canal and external ear normal. There is no impacted cerumen.     Nose: No congestion or rhinorrhea.     Mouth/Throat:     Mouth: Mucous membranes are moist.     Pharynx: Oropharynx is clear. No oropharyngeal exudate or posterior oropharyngeal erythema.  Eyes:      General: No scleral icterus.       Right eye: No discharge.        Left eye: No discharge.     Extraocular Movements: Extraocular movements intact.     Conjunctiva/sclera: Conjunctivae normal.     Pupils: Pupils are equal, round, and reactive to light.  Cardiovascular:     Rate and Rhythm: Normal rate and regular rhythm.     Heart sounds: Normal heart sounds.  Pulmonary:     Effort: Pulmonary effort is normal.     Breath sounds: Normal breath sounds.  Abdominal:     General: Bowel sounds are normal.  Musculoskeletal:        General: No swelling, tenderness, deformity or signs of injury. Normal range of motion.     Cervical back: No rigidity or tenderness.     Right lower leg: No edema.  Lymphadenopathy:     Cervical: No cervical adenopathy.  Skin:    General: Skin is dry.  Neurological:     Mental Status: She is alert and oriented to person, place, and time.     Motor: No weakness.  Psychiatric:        Mood and Affect: Mood normal.        Behavior: Behavior normal.     Assessment & Plan:   Problem List Items Addressed This Visit    None    Visit Diagnoses    Healthcare maintenance    -  Primary   Relevant Orders   CBC   Comp Met (CMET)   Lipid Profile   Urinalysis, Routine w reflex microscopic   Need for Tdap vaccination       Relevant Orders   Tdap vaccine greater than or equal to 7yo IM (Completed)   Ambulatory referral to Gynecology      Outpatient Encounter Medications as of 03/17/2019  Medication Sig  . Multiple Vitamins-Minerals (HAIR SKIN NAILS PO) Take 3 tablets by mouth daily.  Marland Kitchen amoxicillin-clavulanate (AUGMENTIN) 875-125 MG tablet Take 1 tablet by mouth every 12 (twelve) hours. (Patient not taking: Reported on 03/17/2019)  . butalbital-acetaminophen-caffeine (FIORICET, ESGIC) 50-325-40 MG tablet Take 1 tablet by mouth every 6 (six) hours as needed for headache. (Patient not taking: Reported on 03/17/2019)  . etonogestrel (NEXPLANON) 68 MG IMPL implant 1  each by Subdermal route once. Patient received in April  . fluticasone (FLONASE) 50 MCG/ACT nasal spray Place 1 spray into both nostrils daily. (Patient not taking: Reported on 03/17/2019)  . ibuprofen (ADVIL,MOTRIN) 800 MG tablet Take 1 tablet (800 mg total)  by mouth 3 (three) times daily. (Patient not taking: Reported on 03/17/2019)  . loratadine (CLARITIN) 10 MG tablet Take 1 tablet (10 mg total) by mouth daily. One po daily x 5 days (Patient not taking: Reported on 03/17/2019)   No facility-administered encounter medications on file as of 03/17/2019.    Follow-up: Return in about 1 year (around 03/16/2020), or please return fasting for todays blood work..  Patient was given information on health maintenance and disease prevention.  Encouraged her to maintain her healthy lifestyle.  She will follow-up with GYN for women's health, contraception and for the Gardasil vaccine. Libby Maw, MD

## 2019-03-18 ENCOUNTER — Other Ambulatory Visit (INDEPENDENT_AMBULATORY_CARE_PROVIDER_SITE_OTHER): Payer: 59

## 2019-03-18 DIAGNOSIS — Z0000 Encounter for general adult medical examination without abnormal findings: Secondary | ICD-10-CM | POA: Diagnosis not present

## 2019-03-18 LAB — COMPREHENSIVE METABOLIC PANEL
ALT: 10 U/L (ref 0–35)
AST: 15 U/L (ref 0–37)
Albumin: 4.3 g/dL (ref 3.5–5.2)
Alkaline Phosphatase: 53 U/L (ref 39–117)
BUN: 16 mg/dL (ref 6–23)
CO2: 27 mEq/L (ref 19–32)
Calcium: 9.4 mg/dL (ref 8.4–10.5)
Chloride: 103 mEq/L (ref 96–112)
Creatinine, Ser: 0.88 mg/dL (ref 0.40–1.20)
GFR: 96.08 mL/min (ref >60.00)
Glucose, Bld: 91 mg/dL (ref 70–99)
Potassium: 3.6 mEq/L (ref 3.5–5.1)
Sodium: 136 mEq/L (ref 135–145)
Total Bilirubin: 0.8 mg/dL (ref 0.2–1.2)
Total Protein: 7.1 g/dL (ref 6.0–8.3)

## 2019-03-18 LAB — URINALYSIS, ROUTINE W REFLEX MICROSCOPIC
Bilirubin Urine: NEGATIVE
Hgb urine dipstick: NEGATIVE
Ketones, ur: NEGATIVE
Leukocytes,Ua: NEGATIVE
Nitrite: NEGATIVE
RBC / HPF: NONE SEEN (ref 0–?)
Specific Gravity, Urine: 1.02 (ref 1.000–1.030)
Total Protein, Urine: NEGATIVE
Urine Glucose: NEGATIVE
Urobilinogen, UA: 0.2 (ref 0.0–1.0)
pH: 6 (ref 5.0–8.0)

## 2019-03-18 LAB — CBC
HCT: 38.7 % (ref 36.0–46.0)
Hemoglobin: 13.5 g/dL (ref 12.0–15.0)
MCHC: 34.9 g/dL (ref 30.0–36.0)
MCV: 94.1 fl (ref 78.0–100.0)
Platelets: 347 10*3/uL (ref 150.0–400.0)
RBC: 4.12 Mil/uL (ref 3.87–5.11)
RDW: 12.2 % (ref 11.5–15.5)
WBC: 5.1 10*3/uL (ref 4.0–10.5)

## 2019-03-18 LAB — LIPID PANEL
Cholesterol: 155 mg/dL (ref 0–200)
HDL: 45.3 mg/dL (ref >39.00)
LDL Cholesterol: 100 mg/dL — ABNORMAL HIGH (ref 0–99)
NonHDL: 110.08
Total CHOL/HDL Ratio: 3
Triglycerides: 49 mg/dL (ref 0.0–149.0)
VLDL: 9.8 mg/dL (ref 0.0–40.0)

## 2019-04-09 ENCOUNTER — Encounter: Payer: Self-pay | Admitting: Family Medicine

## 2019-04-09 ENCOUNTER — Telehealth (INDEPENDENT_AMBULATORY_CARE_PROVIDER_SITE_OTHER): Payer: 59 | Admitting: Family Medicine

## 2019-04-09 VITALS — Ht 65.0 in | Wt 204.0 lb

## 2019-04-09 DIAGNOSIS — S0300XA Dislocation of jaw, unspecified side, initial encounter: Secondary | ICD-10-CM

## 2019-04-09 MED ORDER — CYCLOBENZAPRINE HCL 10 MG PO TABS: 10.0000 mg | ORAL_TABLET | Freq: Every day | ORAL | 0 refills | Status: DC

## 2019-04-09 MED ORDER — MELOXICAM 7.5 MG PO TABS: 7.5000 mg | ORAL_TABLET | Freq: Every day | ORAL | 0 refills | Status: DC

## 2019-04-09 NOTE — Progress Notes (Signed)
New Patient Office Visit  Subjective:  Patient ID: Melissa Fisher, female    DOB: 07-24-1995  Age: 24 y.o. MRN: [id number]  CC:  Chief Complaint  Patient presents with  . Pain    pain on left side of jaw and ear x 1 week becoming worse x 3 days    HPI Melissa Fisher presents for a 3-day history of pain in the left side of her face just in front of her ear.  Worse when she opens her mouth or yawns.  Worse when she bites down on chewing food.  Admits to some jaw clenching in the evening but not so much during the day.  Has not had access to regular dental care recently.  Plan follow-up with GYN in February.  Past Medical History:  Diagnosis Date  . Abscess   . Hidradenitis suppurativa 2008    History reviewed. No pertinent surgical history.  Family History  Problem Relation Age of Onset  . Diabetes Mother   . Hypertension Father     Social History   Socioeconomic History  . Marital status: Single    Spouse name: Not on file  . Number of children: Not on file  . Years of education: Not on file  . Highest education level: Not on file  Occupational History  . Not on file  Tobacco Use  . Smoking status: Never Smoker  . Smokeless tobacco: Never Used  Substance and Sexual Activity  . Alcohol use: No  . Drug use: Not Currently    Types: Marijuana  . Sexual activity: Not Currently  Other Topics Concern  . Not on file  Social History Narrative  . Not on file   Social Determinants of Health   Financial Resource Strain:   . Difficulty of Paying Living Expenses: Not on file  Food Insecurity:   . Worried About Charity fundraiser in the Last Year: Not on file  . Ran Out of Food in the Last Year: Not on file  Transportation Needs:   . Lack of Transportation (Medical): Not on file  . Lack of Transportation (Non-Medical): Not on file  Physical Activity:   . Days of Exercise per Week: Not on file  . Minutes of Exercise per Session: Not on file  Stress:   . Feeling  of Stress : Not on file  Social Connections:   . Frequency of Communication with Friends and Family: Not on file  . Frequency of Social Gatherings with Friends and Family: Not on file  . Attends Religious Services: Not on file  . Active Member of Clubs or Organizations: Not on file  . Attends Archivist Meetings: Not on file  . Marital Status: Not on file  Intimate Partner Violence:   . Fear of Current or Ex-Partner: Not on file  . Emotionally Abused: Not on file  . Physically Abused: Not on file  . Sexually Abused: Not on file    ROS Review of Systems  Constitutional: Negative.   HENT: Negative for congestion, dental problem, ear pain and postnasal drip.   Respiratory: Negative.   Cardiovascular: Negative.   Gastrointestinal: Negative.   Musculoskeletal: Positive for arthralgias.    Objective:   Today's Vitals: Ht 5\' 5"  (1.651 m)   Wt 204 lb (92.5 kg) Comment: per pt  LMP 03/10/2019 (Approximate)   BMI 33.95 kg/m   Physical Exam Vitals and nursing note reviewed.  Constitutional:      General: She is not in  acute distress.    Appearance: Normal appearance. She is normal weight. She is not ill-appearing, toxic-appearing or diaphoretic.  HENT:     Head: Normocephalic and atraumatic.     Right Ear: External ear normal.     Left Ear: External ear normal.  Eyes:     General: No scleral icterus.       Right eye: No discharge.        Left eye: No discharge.     Conjunctiva/sclera: Conjunctivae normal.  Pulmonary:     Effort: Pulmonary effort is normal.  Neurological:     Mental Status: She is alert and oriented to person, place, and time.  Psychiatric:        Mood and Affect: Mood normal.        Behavior: Behavior normal.     Assessment & Plan:   Problem List Items Addressed This Visit    None    Visit Diagnoses    Dislocation of temporomandibular joint, initial encounter    -  Primary   Relevant Medications   cyclobenzaprine (FLEXERIL) 10 MG tablet    meloxicam (MOBIC) 7.5 MG tablet      Outpatient Encounter Medications as of 04/09/2019  Medication Sig  . Multiple Vitamins-Minerals (HAIR SKIN NAILS PO) Take 3 tablets by mouth daily.  Marland Kitchen amoxicillin-clavulanate (AUGMENTIN) 875-125 MG tablet Take 1 tablet by mouth every 12 (twelve) hours. (Patient not taking: Reported on 03/17/2019)  . butalbital-acetaminophen-caffeine (FIORICET, ESGIC) 50-325-40 MG tablet Take 1 tablet by mouth every 6 (six) hours as needed for headache. (Patient not taking: Reported on 03/17/2019)  . cyclobenzaprine (FLEXERIL) 10 MG tablet Take 1 tablet (10 mg total) by mouth at bedtime. For 14 days and then as needed.  . etonogestrel (NEXPLANON) 68 MG IMPL implant 1 each by Subdermal route once. Patient received in April  . fluticasone (FLONASE) 50 MCG/ACT nasal spray Place 1 spray into both nostrils daily. (Patient not taking: Reported on 03/17/2019)  . ibuprofen (ADVIL,MOTRIN) 800 MG tablet Take 1 tablet (800 mg total) by mouth 3 (three) times daily. (Patient not taking: Reported on 03/17/2019)  . loratadine (CLARITIN) 10 MG tablet Take 1 tablet (10 mg total) by mouth daily. One po daily x 5 days (Patient not taking: Reported on 03/17/2019)  . meloxicam (MOBIC) 7.5 MG tablet Take 1 tablet (7.5 mg total) by mouth daily. For 14 days and then as needed.   No facility-administered encounter medications on file as of 04/09/2019.    Follow-up: Return in about 2 years (around 04/08/2021), or if symptoms worsen or fail to improve, for seek routine dental care when available. Melissa Sax, MD   Virtual Visit via Video Note  I connected with Melissa Fisher on 04/09/19 at 10:00 AM EST by a video enabled telemedicine application and verified that I am speaking with the correct person using two identifiers.  Location: Patient: home alone Provider:    I discussed the limitations of evaluation and management by telemedicine and the availability of in person appointments.  The patient expressed understanding and agreed to proceed.  History of Present Illness:    Observations/Objective:   Assessment and Plan:   Follow Up Instructions:    I discussed the assessment and treatment plan with the patient. The patient was provided an opportunity to ask questions and all were answered. The patient agreed with the plan and demonstrated an understanding of the instructions.   The patient was advised to call back or seek an  in-person evaluation if the symptoms worsen or if the condition fails to improve as anticipated.  I provided 20 minutes of non-face-to-face time during this encounter.   Melissa Sax, MD

## 2019-04-09 NOTE — Patient Instructions (Signed)

## 2019-04-11 ENCOUNTER — Ambulatory Visit: Payer: Self-pay | Admitting: Obstetrics and Gynecology

## 2019-04-22 ENCOUNTER — Other Ambulatory Visit: Payer: Self-pay

## 2019-04-23 ENCOUNTER — Encounter: Payer: Self-pay | Admitting: Women's Health

## 2019-04-23 ENCOUNTER — Ambulatory Visit (INDEPENDENT_AMBULATORY_CARE_PROVIDER_SITE_OTHER): Payer: 59 | Admitting: Women's Health

## 2019-04-23 VITALS — BP 124/80 | Ht 65.0 in | Wt 209.0 lb

## 2019-04-23 DIAGNOSIS — Z01419 Encounter for gynecological examination (general) (routine) without abnormal findings: Secondary | ICD-10-CM

## 2019-04-23 NOTE — Progress Notes (Signed)
Melissa Fisher [birth year] [id number]    History:    Presents for new patient annual exam. Has never had a pap. Had gardasil series. Not currently sexually active. Not currently on any birth control. Cycles are every month, irregular timing during month, last 5 days. Recovering from hidradenitis suppurative flare-up, manages with diet and tea tree oil.   Past medical history, past surgical history, family history and social history were all reviewed and documented in the EPIC chart. Had Nexplanon implant until 2018. Last STD screen in 2018. One time sexual partner between 2018 and now, used protection. Has excellent diet- salmon, chicken, salads, avoids sugar and carbs. Exercises 4-5 times a week, records her progress in TikTok to stay accountable to herself. Lives at home with parents. 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   ROS:  A ROS was performed and pertinent positives and negatives are included.  Exam:  Vitals:   04/23/19 0810  BP: 124/80  Weight: 209 lb (94.8 kg)  Height: 5\' 5"  (1.651 m)   Body mass index is 34.78 kg/m.   General appearance:  Normal Thyroid:  Symmetrical, normal in size, without palpable masses or nodularity. Respiratory  Auscultation:  Clear without wheezing or rhonchi Cardiovascular  Auscultation:  Regular rate, without rubs, murmurs or gallops  Edema/varicosities:  Not grossly evident Abdominal  Soft,nontender, without masses, guarding or rebound.  Liver/spleen:  No organomegaly noted  Hernia:  None appreciated  Skin  Inspection:  Grossly normal   Breasts: Examined lying and sitting. Hidradenitis suppurative on bilateral lower breasts.    Right: Without masses, retractions, discharge or axillary adenopathy.     Left: Without masses, retractions, discharge or axillary adenopathy. Gentitourinary Hidradenitis suppurative on bilateral medial thighs and mons pubis.  Inguinal/mons:  Normal without inguinal adenopathy  External  genitalia:  Normal  BUS/Urethra/Skene's glands:  Normal  Vagina:  Normal  Cervix:  Normal  Uterus:  Normal in size, shape and contour.  Midline and mobile  Adnexa/parametria:     Rt: Without masses or tenderness.   Lt: Without masses or tenderness.  Anus and perineum: Normal  Assessment/Plan:  24 y.o.  SBF G0 for annual exam.   Hidradenitis Suppurative  Monthly 5 day cycle Labs-primary care reports all normal  Plan:  pap. Counseled on birth control options, including Phexxi. Declined birth control now, but will contact office if needed. Counseled on preventing hidradenitis suppurative- avoid shaving, remove all deodorant before shaving, wear cotton at bedtime. Reminded about the importance of eating healthy, exercising, taking a multivitamin, and driving safety. Encouraged to maintain her healthy habits. She will return to the office if she needs birth control, and for her annual visit in one year.  Denies need for STD screen.  [address] [facility], 8:58 AM 04/23/2019

## 2019-04-23 NOTE — Patient Instructions (Addendum)
MVI daily Good to meet you today! gardasil Health Maintenance, Female Adopting a healthy lifestyle and getting preventive care are important in promoting health and wellness. Ask your health care provider about:  The right schedule for you to have regular tests and exams.  Things you can do on your own to prevent diseases and keep yourself healthy. What should I know about diet, weight, and exercise? Eat a healthy diet   Eat a diet that includes plenty of vegetables, fruits, low-fat dairy products, and lean protein.  Do not eat a lot of foods that are high in solid fats, added sugars, or sodium. Maintain a healthy weight Body mass index (BMI) is used to identify weight problems. It estimates body fat based on height and weight. Your health care provider can help determine your BMI and help you achieve or maintain a healthy weight. Get regular exercise Get regular exercise. This is one of the most important things you can do for your health. Most adults should:  Exercise for at least 150 minutes each week. The exercise should increase your heart rate and make you sweat (moderate-intensity exercise).  Do strengthening exercises at least twice a week. This is in addition to the moderate-intensity exercise.  Spend less time sitting. Even light physical activity can be beneficial. Watch cholesterol and blood lipids Have your blood tested for lipids and cholesterol at 24 years of age, then have this test every 5 years. Have your cholesterol levels checked more often if:  Your lipid or cholesterol levels are high.  You are older than 24 years of age.  You are at high risk for heart disease. What should I know about cancer screening? Depending on your health history and family history, you may need to have cancer screening at various ages. This may include screening for:  Breast cancer.  Cervical cancer.  Colorectal cancer.  Skin cancer.  Lung cancer. What should I know about  heart disease, diabetes, and high blood pressure? Blood pressure and heart disease  High blood pressure causes heart disease and increases the risk of stroke. This is more likely to develop in people who have high blood pressure readings, are of African descent, or are overweight.  Have your blood pressure checked: ? Every 3-5 years if you are 25-71 years of age. ? Every year if you are 58 years old or older. Diabetes Have regular diabetes screenings. This checks your fasting blood sugar level. Have the screening done:  Once every three years after age 26 if you are at a normal weight and have a low risk for diabetes.  More often and at a younger age if you are overweight or have a high risk for diabetes. What should I know about preventing infection? Hepatitis B If you have a higher risk for hepatitis B, you should be screened for this virus. Talk with your health care provider to find out if you are at risk for hepatitis B infection. Hepatitis C Testing is recommended for:  Everyone born from [birth year] through 1965.  Anyone with known risk factors for hepatitis C. Sexually transmitted infections (STIs)  Get screened for STIs, including gonorrhea and chlamydia, if: ? You are sexually active and are younger than 24 years of age. ? You are older than 24 years of age and your health care provider tells you that you are at risk for this type of infection. ? Your sexual activity has changed since you were last screened, and you are at increased risk for chlamydia  or gonorrhea. Ask your health care provider if you are at risk.  Ask your health care provider about whether you are at high risk for HIV. Your health care provider may recommend a prescription medicine to help prevent HIV infection. If you choose to take medicine to prevent HIV, you should first get tested for HIV. You should then be tested every 3 months for as long as you are taking the medicine. Pregnancy  If you are about to  stop having your period (premenopausal) and you may become pregnant, seek counseling before you get pregnant.  Take 400 to 800 micrograms (mcg) of folic acid every day if you become pregnant.  Ask for birth control (contraception) if you want to prevent pregnancy. Osteoporosis and menopause Osteoporosis is a disease in which the bones lose minerals and strength with aging. This can result in bone fractures. If you are 65 years old or older, or if you are at risk for osteoporosis and fractures, ask your health care provider if you should:  Be screened for bone loss.  Take a calcium or vitamin D supplement to lower your risk of fractures.  Be given hormone replacement therapy (HRT) to treat symptoms of menopause. Follow these instructions at home: Lifestyle  Do not use any products that contain nicotine or tobacco, such as cigarettes, e-cigarettes, and chewing tobacco. If you need help quitting, ask your health care provider.  Do not use street drugs.  Do not share needles.  Ask your health care provider for help if you need support or information about quitting drugs. Alcohol use  Do not drink alcohol if: ? Your health care provider tells you not to drink. ? You are pregnant, may be pregnant, or are planning to become pregnant.  If you drink alcohol: ? Limit how much you use to 0-1 drink a day. ? Limit intake if you are breastfeeding.  Be aware of how much alcohol is in your drink. In the [place] one drink equals one 12 oz bottle of beer (355 mL), one 5 oz glass of wine (148 mL), or one 1 oz glass of hard liquor (44 mL). General instructions  Schedule regular health, dental, and eye exams.  Stay current with your vaccines.  Tell your health care provider if: ? You often feel depressed. ? You have ever been abused or do not feel safe at home. Summary  Adopting a healthy lifestyle and getting preventive care are important in promoting health and wellness.  Follow your  health care provider's instructions about healthy diet, exercising, and getting tested or screened for diseases.  Follow your health care provider's instructions on monitoring your cholesterol and blood pressure. This information is not intended to replace advice given to you by your health care provider. Make sure you discuss any questions you have with your health care provider. Document Revised: 02/13/2018 Document Reviewed: 02/13/2018 Elsevier Patient Education  2020 Elsevier Inc.  

## 2019-04-24 LAB — PAP IG W/ RFLX HPV ASCU

## 2019-05-02 ENCOUNTER — Other Ambulatory Visit: Payer: Self-pay | Admitting: Family Medicine

## 2019-05-02 DIAGNOSIS — S0300XA Dislocation of jaw, unspecified side, initial encounter: Secondary | ICD-10-CM

## 2019-05-02 NOTE — Telephone Encounter (Signed)
Patient called back and stated that she did not need a refill she is ok with her medication at this time. CB is [phone]

## 2019-05-12 ENCOUNTER — Other Ambulatory Visit: Payer: Self-pay

## 2019-05-12 ENCOUNTER — Encounter: Payer: Self-pay | Admitting: Nurse Practitioner

## 2019-05-12 ENCOUNTER — Telehealth (INDEPENDENT_AMBULATORY_CARE_PROVIDER_SITE_OTHER): Payer: 59 | Admitting: Nurse Practitioner

## 2019-05-12 VITALS — Temp 97.3°F | Ht 65.0 in | Wt 203.4 lb

## 2019-05-12 DIAGNOSIS — L732 Hidradenitis suppurativa: Secondary | ICD-10-CM | POA: Diagnosis not present

## 2019-05-12 MED ORDER — SULFAMETHOXAZOLE-TRIMETHOPRIM 800-160 MG PO TABS: 1.0000 | ORAL_TABLET | Freq: Two times a day (BID) | ORAL | 0 refills | Status: DC

## 2019-05-12 MED ORDER — CHLORHEXIDINE GLUCONATE 4 % EX LIQD: CUTANEOUS | 0 refills | Status: DC

## 2019-05-12 NOTE — Patient Instructions (Signed)
Please discuss breast reduction recommendation with pcp.  You will be contacted to schedule appt with dermatology  Use warm compress 2-3times a day (50mins at a time)  Hidradenitis Suppurativa Hidradenitis suppurativa is a long-term (chronic) skin disease. It is similar to a severe form of acne, but it affects areas of the body where acne would be unusual, especially areas of the body where skin rubs against skin and becomes moist. These include:  Underarms.  Groin.  Genital area.  Buttocks.  Upper thighs.  Breasts. Hidradenitis suppurativa may start out as small lumps or pimples caused by blocked sweat glands or hair follicles. Pimples may develop into deep sores that break open (rupture) and drain pus. Over time, affected areas of skin may thicken and become scarred. This condition is rare and does not spread from person to person (non-contagious). What are the causes? The exact cause of this condition is not known. It may be related to:  Female and female hormones.  An overactive disease-fighting system (immune system). The immune system may over-react to blocked hair follicles or sweat glands and cause swelling and pus-filled sores. What increases the risk? You are more likely to develop this condition if you:  Are female.  Are 62-38 years old.  Have a family history of hidradenitis suppurativa.  Have a personal history of acne.  Are overweight.  Smoke.  Take the medicine lithium. What are the signs or symptoms? The first symptoms are usually painful bumps in the skin, similar to pimples. The condition may get worse over time (progress), or it may only cause mild symptoms. If the disease progresses, symptoms may include:  Skin bumps getting bigger and growing deeper into the skin.  Bumps rupturing and draining pus.  Itchy, infected skin.  Skin getting thicker and scarred.  Tunnels under the skin (fistulas) where pus drains from a bump.  Pain during daily  activities, such as pain during walking if your groin area is affected.  Emotional problems, such as stress or depression. This condition may affect your appearance and your ability or willingness to wear certain clothes or do certain activities. How is this diagnosed? This condition is diagnosed by a health care provider who specializes in skin diseases (dermatologist). You may be diagnosed based on:  Your symptoms and medical history.  A physical exam.  Testing a pus sample for infection.  Blood tests. How is this treated? Your treatment will depend on how severe your symptoms are. The same treatment will not work for everybody with this condition. You may need to try several treatments to find what works best for you. Treatment may include:  Cleaning and bandaging (dressing) your wounds as needed.  Lifestyle changes, such as new skin care routines.  Taking medicines, such as: ? Antibiotics. ? Acne medicines. ? Medicines to reduce the activity of the immune system. ? A diabetes medicine (metformin). ? Birth control pills, for women. ? Steroids to reduce swelling and pain.  Working with a mental health care provider, if you experience emotional distress due to this condition. If you have severe symptoms that do not get better with medicine, you may need surgery. Surgery may involve:  Using a laser to clear the skin and remove hair follicles.  Opening and draining deep sores.  Removing the areas of skin that are diseased and scarred. Follow these instructions at home: Medicines   Take over-the-counter and prescription medicines only as told by your health care provider.  If you were prescribed an antibiotic medicine,  take it as told by your health care provider. Do not stop taking the antibiotic even if your condition improves. Skin care  If you have open wounds, cover them with a clean dressing as told by your health care provider. Keep wounds clean by washing them  gently with soap and water when you bathe.  Do not shave the areas where you get hidradenitis suppurativa.  Do not wear deodorant.  Wear loose-fitting clothes.  Try to avoid getting overheated or sweaty. If you get sweaty or wet, change into clean, dry clothes as soon as you can.  To help relieve pain and itchiness, cover sore areas with a warm, clean washcloth (warm compress) for 5-10 minutes as often as needed.  If told by your health care provider, take a bleach bath twice a week: ? Fill your bathtub halfway with water. ? Pour in  cup of unscented household bleach. ? Soak in the tub for 5-10 minutes. ? Only soak from the neck down. Avoid water on your face and hair. ? Shower to rinse off the bleach from your skin. General instructions  Learn as much as you can about your disease so that you have an active role in your treatment. Work closely with your health care provider to find treatments that work for you.  If you are overweight, work with your health care provider to lose weight as recommended.  Do not use any products that contain nicotine or tobacco, such as cigarettes and e-cigarettes. If you need help quitting, ask your health care provider.  If you struggle with living with this condition, talk with your health care provider or work with a mental health care provider as recommended.  Keep all follow-up visits as told by your health care provider. This is important. Where to find more information  Hidradenitis Suppurativa Foundation, Inc.: https://www.hs-foundation.org/ Contact a health care provider if you have:  A flare-up of hidradenitis suppurativa.  A fever or chills.  Trouble controlling your symptoms at home.  Trouble doing your daily activities because of your symptoms.  Trouble dealing with emotional problems related to your condition. Summary  Hidradenitis suppurativa is a long-term (chronic) skin disease. It is similar to a severe form of acne, but  it affects areas of the body where acne would be unusual.  The first symptoms are usually painful bumps in the skin, similar to pimples. The condition may get worse over time (progress), or it may only cause mild symptoms.  If you have open wounds, cover them with a clean dressing as told by your health care provider. Keep wounds clean by washing them gently with soap and water when you bathe.  Besides skin care, treatment may include medicines, laser treatment, and surgery. This information is not intended to replace advice given to you by your health care provider. Make sure you discuss any questions you have with your health care provider. Document Revised: 02/28/2017 Document Reviewed: 02/28/2017 Elsevier Patient Education  2020 [employer].

## 2019-05-12 NOTE — Progress Notes (Signed)
Virtual Visit via Video Note  I connected with@ on 05/12/19 at 12:30 PM EST by a video enabled telemedicine application and verified that I am speaking with the correct person using two identifiers.  Location: Patient:Home Provider: Office Participants: patient and provider   I discussed the limitations of evaluation and management by telemedicine and the availability of in person appointments. I also discussed with the patient that there may be a patient responsible charge related to this service. The patient expressed understanding and agreed to proceed.  ME:[id number] abscess under breasts  History of Present Illness: Rash This is a recurrent problem. The problem has been waxing and waning since onset. The affected locations include the torso. The rash is characterized by pain, redness and swelling. She was exposed to nothing. Pertinent negatives include no fatigue or fever. Treatments tried: warm compress. The treatment provided no relief. There is no history of allergies, asthma, eczema or varicella.   Observations/Objective: Physical Exam  Constitutional: She is oriented to person, place, and time.  Pulmonary/Chest: Effort normal.  Neurological: She is alert and oriented to person, place, and time.  Skin: Rash noted. There is erythema.       Assessment and Plan: Deysha was seen today for rash.  Diagnoses and all orders for this visit:  Suppurative hidradenitis -     Ambulatory referral to Dermatology -     sulfamethoxazole-trimethoprim (BACTRIM DS) 800-160 MG tablet; Take 1 tablet by mouth 2 (two) times daily. -     chlorhexidine (HIBICLENS) 4 % external liquid; Apply topically once a week.   Follow Up Instructions: See avs   I discussed the assessment and treatment plan with the patient. The patient was provided an opportunity to ask questions and all were answered. The patient agreed with the plan and demonstrated an understanding of the instructions.   The  patient was advised to call back or seek an in-person evaluation if the symptoms worsen or if the condition fails to improve as anticipated.  Alysia Penna, NP

## 2019-05-15 ENCOUNTER — Encounter: Payer: Self-pay | Admitting: Nurse Practitioner

## 2019-05-21 ENCOUNTER — Telehealth (INDEPENDENT_AMBULATORY_CARE_PROVIDER_SITE_OTHER): Payer: 59 | Admitting: Family Medicine

## 2019-05-21 ENCOUNTER — Encounter: Payer: Self-pay | Admitting: Family Medicine

## 2019-05-21 ENCOUNTER — Telehealth: Payer: Self-pay | Admitting: Family Medicine

## 2019-05-21 VITALS — Ht 65.0 in

## 2019-05-21 DIAGNOSIS — L304 Erythema intertrigo: Secondary | ICD-10-CM | POA: Insufficient documentation

## 2019-05-21 DIAGNOSIS — M549 Dorsalgia, unspecified: Secondary | ICD-10-CM | POA: Diagnosis not present

## 2019-05-21 MED ORDER — KETOCONAZOLE 2 % EX CREA
1.0000 | TOPICAL_CREAM | Freq: Every day | CUTANEOUS | 0 refills | Status: DC
Start: 2019-05-21 — End: 2019-06-06

## 2019-05-21 NOTE — Patient Instructions (Addendum)
Intertrigo Intertrigo is skin irritation or inflammation (dermatitis) that occurs when folds of skin rub together. The irritation can cause a rash and make skin raw and itchy. This condition most commonly occurs in the skin folds of these areas:  Toes.  Armpits.  Groin.  Under the belly.  Under the breasts.  Buttocks. Intertrigo is not passed from person to person (is not contagious). What are the causes? This condition is caused by heat, moisture, rubbing (friction), and not enough air circulation. The condition can be made worse by:  Sweat.  Bacteria.  A fungus, such as yeast. What increases the risk? This condition is more likely to occur if you have moisture in your skin folds. You are more likely to develop this condition if you:  Have diabetes.  Are overweight.  Are not able to move around or are not active.  Live in a warm and moist climate.  Wear splints, braces, or other medical devices.  Are not able to control your bowels or bladder (have incontinence). What are the signs or symptoms? Symptoms of this condition include:  A pink or red skin rash in the skin fold or near the skin fold.  Raw or scaly skin.  Itchiness.  A burning feeling.  Bleeding.  Leaking fluid.  A bad smell. How is this diagnosed? This condition is diagnosed with a medical history and physical exam. You may also have a skin swab to test for bacteria or a fungus. How is this treated? This condition may be treated by:  Cleaning and drying your skin.  Taking an antibiotic medicine or using an antibiotic skin cream for a bacterial infection.  Using an antifungal cream on your skin or taking pills for an infection that was caused by a fungus, such as yeast.  Using a steroid ointment to relieve itchiness and irritation.  Separating the skin fold with a clean cotton cloth to absorb moisture and allow air to flow into the area. Follow these instructions at home:  Keep the  affected area clean and dry.  Do not scratch your skin.  Stay in a cool environment as much as possible. Use an air conditioner or fan, if available.  Apply over-the-counter and prescription medicines only as told by your health care provider.  If you were prescribed an antibiotic medicine, use it as told by your health care provider. Do not stop using the antibiotic even if your condition improves.  Keep all follow-up visits as told by your health care provider. This is important. How is this prevented?   Maintain a healthy weight.  Take care of your feet, especially if you have diabetes. Foot care includes: ? Wearing shoes that fit well. ? Keeping your feet dry. ? Wearing clean, breathable socks.  Protect the skin around your groin and buttocks, especially if you have incontinence. Skin protection includes: ? Following a regular cleaning routine. ? Using skin protectant creams, powders, or ointments. ? Changing protection pads frequently.  Do not wear tight clothes. Wear clothes that are loose, absorbent, and made of cotton.  Wear a bra that gives good support, if needed.  Shower and dry yourself well after activity or exercise. Use a hair dryer on a cool setting to dry between skin folds, especially after you bathe.  If you have diabetes, keep your blood sugar under control. Contact a health care provider if:  Your symptoms do not improve with treatment.  Your symptoms get worse or they spread.  You notice increased redness and   warmth.  You have a fever. Summary  Intertrigo is skin irritation or inflammation (dermatitis) that occurs when folds of skin rub together.  This condition is caused by heat, moisture, rubbing (friction), and not enough air circulation.  This condition may be treated by cleaning and drying your skin and with medicines.  Apply over-the-counter and prescription medicines only as told by your health care provider.  Keep all follow-up visits  as told by your health care provider. This is important. This information is not intended to replace advice given to you by your health care provider. Make sure you discuss any questions you have with your health care provider. Document Revised: 07/23/2017 Document Reviewed: 07/23/2017 Elsevier Patient Education  2020 Elsevier Inc.  

## 2019-05-21 NOTE — Telephone Encounter (Signed)
Patient is calling and stated that a rx for Intertrigo was supposed to sent [facility] on [address]. CB is [phone]

## 2019-05-21 NOTE — Progress Notes (Signed)
Established Patient Office Visit  Subjective:  Patient ID: Melissa Fisher, female    DOB: [birth year]  Age: 24 y.o. MRN: [id number]  CC:  Chief Complaint  Patient presents with  . Rash    skin irritated     HPI Melissa Fisher presents for follow up of a rash under her breasts. She has pendulous breasts. Also with upper back pain. Chiropractic has suggested breast reduction surgery.  Past Medical History:  Diagnosis Date  . Abscess   . Hidradenitis suppurativa 2008    No past surgical history on file.  Family History  Problem Relation Age of Onset  . Diabetes Mother   . Hypertension Father     Social History   Socioeconomic History  . Marital status: Single    Spouse name: Not on file  . Number of children: Not on file  . Years of education: Not on file  . Highest education level: Not on file  Occupational History  . Not on file  Tobacco Use  . Smoking status: Never Smoker  . Smokeless tobacco: Never Used  Substance and Sexual Activity  . Alcohol use: No  . Drug use: Not Currently  . Sexual activity: Not Currently    Comment: intercourse age 28, less than 5 sexual partners  Other Topics Concern  . Not on file  Social History Narrative  . Not on file   Social Determinants of Health   Financial Resource Strain:   . Difficulty of Paying Living Expenses:   Food Insecurity:   . Worried About Programme researcher, broadcasting/film/video in the Last Year:   . Barista in the Last Year:   Transportation Needs:   . Freight forwarder (Medical):   Marland Kitchen Lack of Transportation (Non-Medical):   Physical Activity:   . Days of Exercise per Week:   . Minutes of Exercise per Session:   Stress:   . Feeling of Stress :   Social Connections:   . Frequency of Communication with Friends and Family:   . Frequency of Social Gatherings with Friends and Family:   . Attends Religious Services:   . Active Member of Clubs or Organizations:   . Attends Banker Meetings:   Marland Kitchen  Marital Status:   Intimate Partner Violence:   . Fear of Current or Ex-Partner:   . Emotionally Abused:   Marland Kitchen Physically Abused:   . Sexually Abused:     Outpatient Medications Prior to Visit  Medication Sig Dispense Refill  . chlorhexidine (HIBICLENS) 4 % external liquid Apply topically once a week. 120 mL 0  . Multiple Vitamin (MULTIVITAMIN) capsule Take 1 capsule by mouth daily.    Marland Kitchen sulfamethoxazole-trimethoprim (BACTRIM DS) 800-160 MG tablet Take 1 tablet by mouth 2 (two) times daily. 14 tablet 0  . Turmeric (QC TUMERIC COMPLEX PO) Take by mouth. daily     No facility-administered medications prior to visit.    No Known Allergies  ROS Review of Systems  Constitutional: Negative.   Respiratory: Negative.   Cardiovascular: Negative.   Gastrointestinal: Negative.   Skin: Positive for color change and rash.      Objective:    Physical Exam  Constitutional: She is oriented to person, place, and time. She appears well-developed and well-nourished. No distress.  HENT:  Head: Normocephalic and atraumatic.  Right Ear: External ear normal.  Left Ear: External ear normal.  Eyes: Right eye exhibits no discharge. Left eye exhibits no discharge. No scleral icterus.  Neck: No JVD present. No tracheal deviation present.  Pulmonary/Chest: Effort normal. No stridor.  Neurological: She is alert and oriented to person, place, and time.  Skin: She is not diaphoretic.     Psychiatric: She has a normal mood and affect. Her behavior is normal.    Ht 5\' 5"  (1.651 m)   BMI 33.85 kg/m  Wt Readings from Last 3 Encounters:  05/12/19 203 lb 6.4 oz (92.3 kg)  04/23/19 209 lb (94.8 kg)  04/09/19 204 lb (92.5 kg)     Health Maintenance Due  Topic Date Due  . HIV Screening  Never done  . PAP SMEAR-Modifier  Never done    There are no preventive care reminders to display for this patient.  No results found for: TSH Lab Results  Component Value Date   WBC 5.1 03/18/2019   HGB  13.5 03/18/2019   HCT 38.7 03/18/2019   MCV 94.1 03/18/2019   PLT 347.0 03/18/2019   Lab Results  Component Value Date   NA 136 03/18/2019   K 3.6 03/18/2019   CO2 27 03/18/2019   GLUCOSE 91 03/18/2019   BUN 16 03/18/2019   CREATININE 0.88 03/18/2019   BILITOT 0.8 03/18/2019   ALKPHOS 53 03/18/2019   AST 15 03/18/2019   ALT 10 03/18/2019   PROT 7.1 03/18/2019   ALBUMIN 4.3 03/18/2019   CALCIUM 9.4 03/18/2019   GFR 96.08 03/18/2019   Lab Results  Component Value Date   CHOL 155 03/18/2019   Lab Results  Component Value Date   HDL 45.30 03/18/2019   Lab Results  Component Value Date   LDLCALC 100 (H) 03/18/2019   Lab Results  Component Value Date   TRIG 49.0 03/18/2019   Lab Results  Component Value Date   CHOLHDL 3 03/18/2019   No results found for: HGBA1C    Assessment & Plan:   Problem List Items Addressed This Visit      Musculoskeletal and Integument   Intertrigo - Primary   Relevant Medications   ketoconazole (NIZORAL) 2 % cream     Other   Upper back pain      Meds ordered this encounter  Medications  . ketoconazole (NIZORAL) 2 % cream    Sig: Apply 1 application topically daily. Apply a thin coat under breast daily. Please keep are as dry as possible throughout the day.    Dispense:  60 g    Refill:  0    Follow-up: No follow-ups on file.   Patient was given info on intertrigo. Will try nizoral. Asked her to keep the area as dry as possible with a supporting bra. Derm appointment also in April. Melissa Fisher need breast reduction.   Virtual Visit via Video Note  I connected with Melissa Fisher on 05/21/19 at  9:30 AM EDT by a video enabled telemedicine application and verified that I am speaking with the correct person using two identifiers.  Location: Patient: home Provider:    I discussed the limitations of evaluation and management by telemedicine and the availability of in person appointments. The patient expressed understanding and  agreed to proceed.  History of Present Illness:    Observations/Objective:   Assessment and Plan:   Follow Up Instructions:    I discussed the assessment and treatment plan with the patient. The patient was provided an opportunity to ask questions and all were answered. The patient agreed with the plan and demonstrated an understanding of the instructions.   The patient was  advised to call back or seek an in-person evaluation if the symptoms worsen or if the condition fails to improve as anticipated.  I provided 25 minutes of non-face-to-face time during this encounter.   Libby Maw, MD  Libby Maw, MD

## 2019-05-21 NOTE — Telephone Encounter (Signed)
Patient calling states that she was supposed to have an antibiotic sent in with cream but all she received was the cream. I did not see anything in her chart about two prescriptions were there any other medications patient needed? Please advise.

## 2019-05-22 NOTE — Telephone Encounter (Signed)
Patient verbally understood message below.  

## 2019-05-22 NOTE — Telephone Encounter (Signed)
Just want to use the antifungal cream at this point.

## 2019-06-06 ENCOUNTER — Other Ambulatory Visit: Payer: Self-pay

## 2019-06-06 ENCOUNTER — Encounter (HOSPITAL_COMMUNITY): Payer: Self-pay | Admitting: Family Medicine

## 2019-06-06 ENCOUNTER — Ambulatory Visit (HOSPITAL_COMMUNITY)
Admission: EM | Admit: 2019-06-06 | Discharge: 2019-06-06 | Disposition: A | Discharge status: Home / Self Care | Payer: 59 | Attending: Family Medicine | Admitting: Family Medicine

## 2019-06-06 DIAGNOSIS — R21 Rash and other nonspecific skin eruption: Secondary | ICD-10-CM | POA: Diagnosis not present

## 2019-06-06 DIAGNOSIS — L732 Hidradenitis suppurativa: Secondary | ICD-10-CM | POA: Diagnosis not present

## 2019-06-06 MED ORDER — DOXYCYCLINE HYCLATE 100 MG PO TABS: 100.0000 mg | ORAL_TABLET | Freq: Two times a day (BID) | ORAL | 0 refills | Status: DC

## 2019-06-06 NOTE — ED Triage Notes (Signed)
Pt is here with a rash under her left breast that started Monday, states she has a hx of this.

## 2019-06-06 NOTE — Discharge Instructions (Addendum)
The ketoconazole (Nizoral) cream will not help this.  Continue the warm compresses and start the antibiotic.  Wash with Hibiclens (chlorhexidine) twice daily

## 2019-06-06 NOTE — ED Provider Notes (Signed)
[facility]    CSN: [id number] Arrival date & time: 06/06/19  1829      History   Chief Complaint Chief Complaint  Patient presents with  . Rash    Left breast    HPI Melissa Fisher is a 24 y.o. female.   Established [facility] patient   Patient presents today with rash and swelling of left breast.  Patient has a h/o hidradenitis suppurativa and this new papular rash started a week ago and is not responding to Nizoral.  Patient has appt with dermatologist in a week.     Past Medical History:  Diagnosis Date  . Abscess   . Hidradenitis suppurativa 2008    Patient Active Problem List   Diagnosis Date Noted  . Intertrigo 05/21/2019  . Upper back pain 05/21/2019  . Hidradenitis suppurativa of right axilla 04/05/2012    History reviewed. No pertinent surgical history.  OB History    Gravida  0   Para  0   Term  0   Preterm  0   AB  0   Living  0     SAB  0   TAB  0   Ectopic  0   Multiple  0   Live Births  0            Home Medications    Prior to Admission medications   Medication Sig Start Date End Date Taking? Authorizing Provider  chlorhexidine (HIBICLENS) 4 % external liquid Apply topically once a week. 05/12/19   Nche, Bonna Gains, NP  doxycycline (VIBRA-TABS) 100 MG tablet Take 1 tablet (100 mg total) by mouth 2 (two) times daily. 06/06/19   Elvina Sidle, MD  Multiple Vitamin (MULTIVITAMIN) capsule Take 1 capsule by mouth daily.    [provider]  Turmeric (QC TUMERIC COMPLEX PO) Take by mouth. daily    [provider]    Family History Family History  Problem Relation Age of Onset  . Diabetes Mother   . Hypertension Father     Social History Social History   Tobacco Use  . Smoking status: Never Smoker  . Smokeless tobacco: Never Used  Substance Use Topics  . Alcohol use: No  . Drug use: Not Currently     Allergies   Patient has no known allergies.   Review of Systems Review of  Systems  Skin: Positive for rash.  All other systems reviewed and are negative.    Physical Exam Triage Vital Signs ED Triage Vitals  Enc Vitals Group     BP      Pulse      Resp      Temp      Temp src      SpO2      Weight      Height      Head Circumference      Peak Flow      Pain Score      Pain Loc      Pain Edu?      Excl. in GC?    No data found.  Updated Vital Signs BP 111/70 (BP Location: Right Arm)   Pulse 80   Temp 98.3 F (36.8 C) (Oral)   Resp 18   Wt 92.5 kg   SpO2 100%   BMI 33.95 kg/m    Physical Exam Vitals and nursing note reviewed.  Constitutional:      Appearance: Normal appearance.  Eyes:     Conjunctiva/sclera:  Conjunctivae normal.  Pulmonary:     Effort: Pulmonary effort is normal.  Musculoskeletal:     Cervical back: Normal range of motion and neck supple.  Skin:    General: Skin is warm and dry.     Comments: Two pustular areas under left breast c/w hidradenitis flare.   Neurological:     General: No focal deficit present.     Mental Status: She is alert.  Psychiatric:        Mood and Affect: Mood normal.      UC Treatments / Results  Labs (all labs ordered are listed, but only abnormal results are displayed) Labs Reviewed - No data to display  EKG   Radiology No results found.  Procedures Procedures (including critical care time)  Medications Ordered in UC Medications - No data to display  Initial Impression / Assessment and Plan / UC Course  I have reviewed the triage vital signs and the nursing notes.  Pertinent labs & imaging results that were available during my care of the patient were reviewed by me and considered in my medical decision making (see chart for details).    Final Clinical Impressions(s) / UC Diagnoses   Final diagnoses:  Hidradenitis suppurativa  Rash     Discharge Instructions     The ketoconazole (Nizoral) cream will not help this.  Continue the warm compresses and start the  antibiotic.  Wash with Hibiclens (chlorhexidine) twice daily    ED Prescriptions    Medication Sig Dispense Auth. Provider   doxycycline (VIBRA-TABS) 100 MG tablet Take 1 tablet (100 mg total) by mouth 2 (two) times daily. 20 tablet Robyn Haber, MD     I have reviewed the PDMP during this encounter.   Robyn Haber, MD 06/06/19 1008

## 2019-06-30 ENCOUNTER — Ambulatory Visit (INDEPENDENT_AMBULATORY_CARE_PROVIDER_SITE_OTHER): Payer: 59 | Admitting: Family Medicine

## 2019-06-30 ENCOUNTER — Other Ambulatory Visit: Payer: Self-pay

## 2019-06-30 ENCOUNTER — Encounter: Payer: Self-pay | Admitting: Family Medicine

## 2019-06-30 VITALS — BP 114/70 | HR 93 | Temp 97.0°F | Ht 65.0 in | Wt 206.8 lb

## 2019-06-30 DIAGNOSIS — L739 Follicular disorder, unspecified: Secondary | ICD-10-CM | POA: Diagnosis not present

## 2019-06-30 MED ORDER — DOXYCYCLINE HYCLATE 100 MG PO TABS: 100.0000 mg | ORAL_TABLET | Freq: Two times a day (BID) | ORAL | 0 refills | Status: DC

## 2019-06-30 NOTE — Progress Notes (Signed)
Established Patient Office Visit  Subjective:  Patient ID: Melissa Fisher, female    DOB: [birth year]  Age: 24 y.o. MRN: [id number]  CC:  Chief Complaint  Patient presents with  . Recurrent Skin Infections    swelling under left breast come and go for 1 month seen at ED would like antibiotics due to coming back.     HPI Konrad Felix presents for follow-up of her intertrigo underneath her breasts and associated folliculitis/hidradenitis.  Ketoconazole's helped the rash but he did not improve the inflamed papules associated with the rash.  She was seen again in the emergency room and placed on a short course of doxy seem to help.   Past Medical History:  Diagnosis Date  . Abscess   . Hidradenitis suppurativa 2008    No past surgical history on file.  Family History  Problem Relation Age of Onset  . Diabetes Mother   . Hypertension Father     Social History   Socioeconomic History  . Marital status: Single    Spouse name: Not on file  . Number of children: Not on file  . Years of education: Not on file  . Highest education level: Not on file  Occupational History  . Not on file  Tobacco Use  . Smoking status: Never Smoker  . Smokeless tobacco: Never Used  Substance and Sexual Activity  . Alcohol use: No  . Drug use: Not Currently  . Sexual activity: Not Currently    Birth control/protection: None    Comment: intercourse age 43, less than 5 sexual partners  Other Topics Concern  . Not on file  Social History Narrative  . Not on file   Social Determinants of Health   Financial Resource Strain:   . Difficulty of Paying Living Expenses:   Food Insecurity:   . Worried About Charity fundraiser in the Last Year:   . Arboriculturist in the Last Year:   Transportation Needs:   . Film/video editor (Medical):   Marland Kitchen Lack of Transportation (Non-Medical):   Physical Activity:   . Days of Exercise per Week:   . Minutes of Exercise per Session:   Stress:   .  Feeling of Stress :   Social Connections:   . Frequency of Communication with Friends and Family:   . Frequency of Social Gatherings with Friends and Family:   . Attends Religious Services:   . Active Member of Clubs or Organizations:   . Attends Archivist Meetings:   Marland Kitchen Marital Status:   Intimate Partner Violence:   . Fear of Current or Ex-Partner:   . Emotionally Abused:   Marland Kitchen Physically Abused:   . Sexually Abused:     Outpatient Medications Prior to Visit  Medication Sig Dispense Refill  . Multiple Vitamin (MULTIVITAMIN) capsule Take 1 capsule by mouth daily.    . Turmeric (QC TUMERIC COMPLEX PO) Take by mouth. daily    . chlorhexidine (HIBICLENS) 4 % external liquid Apply topically once a week. (Patient not taking: Reported on 06/30/2019) 120 mL 0  . doxycycline (VIBRA-TABS) 100 MG tablet Take 1 tablet (100 mg total) by mouth 2 (two) times daily. (Patient not taking: Reported on 06/30/2019) 20 tablet 0   No facility-administered medications prior to visit.    No Known Allergies  ROS Review of Systems  Constitutional: Negative for diaphoresis, fatigue, fever and unexpected weight change.  Respiratory: Negative.   Cardiovascular: Negative.   Gastrointestinal: Negative.  Negative for nausea and vomiting.  Musculoskeletal: Negative for arthralgias and myalgias.  Skin: Positive for color change and rash. Negative for pallor and wound.  Allergic/Immunologic: Negative for immunocompromised state.  Hematological: Does not bruise/bleed easily.  Psychiatric/Behavioral: Negative.       Objective:    Physical Exam  Constitutional: She is oriented to person, place, and time. She appears well-developed and well-nourished. No distress.  HENT:  Head: Normocephalic and atraumatic.  Right Ear: External ear normal.  Left Ear: External ear normal.  Eyes: Conjunctivae are normal. Right eye exhibits no discharge. Left eye exhibits no discharge. No scleral icterus.  Neck: No JVD  present. No tracheal deviation present.  Pulmonary/Chest: Effort normal. No stridor.  Musculoskeletal:        General: No edema.  Neurological: She is alert and oriented to person, place, and time.  Skin: Skin is warm and dry. She is not diaphoretic.     Psychiatric: She has a normal mood and affect. Her behavior is normal.    BP 114/70   Pulse 93   Temp (!) 97 F (36.1 C) (Tympanic)   Ht 5\' 5"  (1.651 m)   Wt 206 lb 12.8 oz (93.8 kg)   SpO2 98%   BMI 34.41 kg/m  Wt Readings from Last 3 Encounters:  06/30/19 206 lb 12.8 oz (93.8 kg)  06/06/19 204 lb (92.5 kg)  05/12/19 203 lb 6.4 oz (92.3 kg)     Health Maintenance Due  Topic Date Due  . HIV Screening  Never done  . PAP SMEAR-Modifier  Never done    There are no preventive care reminders to display for this patient.  No results found for: TSH Lab Results  Component Value Date   WBC 5.1 03/18/2019   HGB 13.5 03/18/2019   HCT 38.7 03/18/2019   MCV 94.1 03/18/2019   PLT 347.0 03/18/2019   Lab Results  Component Value Date   NA 136 03/18/2019   K 3.6 03/18/2019   CO2 27 03/18/2019   GLUCOSE 91 03/18/2019   BUN 16 03/18/2019   CREATININE 0.88 03/18/2019   BILITOT 0.8 03/18/2019   ALKPHOS 53 03/18/2019   AST 15 03/18/2019   ALT 10 03/18/2019   PROT 7.1 03/18/2019   ALBUMIN 4.3 03/18/2019   CALCIUM 9.4 03/18/2019   GFR 96.08 03/18/2019   Lab Results  Component Value Date   CHOL 155 03/18/2019   Lab Results  Component Value Date   HDL 45.30 03/18/2019   Lab Results  Component Value Date   LDLCALC 100 (H) 03/18/2019   Lab Results  Component Value Date   TRIG 49.0 03/18/2019   Lab Results  Component Value Date   CHOLHDL 3 03/18/2019   No results found for: HGBA1C    Assessment & Plan:   Problem List Items Addressed This Visit      Musculoskeletal and Integument   Folliculitis - Primary   Relevant Medications   doxycycline (VIBRA-TABS) 100 MG tablet      Meds ordered this encounter    Medications  . doxycycline (VIBRA-TABS) 100 MG tablet    Sig: Take 1 tablet (100 mg total) by mouth 2 (two) times daily.    Dispense:  20 tablet    Refill:  0    Follow-up: Return Follow up with dermatology on 5/7 and they will assume care..   Will support patient's desire for breast reduction if dermatology also feels as though it is indicated.   7/7, MD

## 2019-07-22 ENCOUNTER — Telehealth: Payer: Self-pay | Admitting: Family Medicine

## 2019-07-22 NOTE — Telephone Encounter (Signed)
Patient is calling and wanted to see if Dr. Doreene Burke received office nurse from the dermatologist office. CB is [phone]

## 2019-08-01 NOTE — Telephone Encounter (Signed)
Patient is calling back for update on previous message left. Pls advise. CB is [phone]

## 2019-08-05 NOTE — Telephone Encounter (Signed)
Pt called back and said she wanted to speak to nurse, I transferred to [facility]

## 2019-08-05 NOTE — Telephone Encounter (Signed)
I spoke with pt and she informed me that she had followed up with [facility] on May 7th.  Pt saw Dr. Bufford Buttner.  Pt would like to know if the notes were sent to this office so Dr. Doreene Burke to follow up on going forward to pt's breast reduction.  I informed pt that I would get the message to Dr. Doreene Burke and see if the notes were sent from Dr. Marlowe Kays office.  I also told pt that someone will call her with feedback from Dr. Doreene Burke or just to let her know the status of position by the end of the week.  Please advise.

## 2019-08-05 NOTE — Telephone Encounter (Signed)
Haven't seen it 

## 2019-08-07 NOTE — Telephone Encounter (Signed)
Have you seen any paperwork/notes from dermatology for this pt?

## 2019-08-11 ENCOUNTER — Telehealth: Payer: Self-pay | Admitting: Family Medicine

## 2019-08-11 NOTE — Telephone Encounter (Signed)
Patient states she has called twice before and spoken to nurse regarding moving forward with her breast reduction surgery. Also states someone said they would call her dermatologist office and have notes sent over. Please contact patient with what she needs to do next.

## 2019-08-12 ENCOUNTER — Encounter: Payer: Self-pay | Admitting: Family Medicine

## 2019-08-12 NOTE — Telephone Encounter (Signed)
Patient called again today and states she has not been contacted yet and has been calling for two weeks about this issue.

## 2019-08-13 NOTE — Telephone Encounter (Signed)
[facility] requesting visit notes/thx dmf

## 2019-09-01 ENCOUNTER — Encounter: Payer: Self-pay | Admitting: Family Medicine

## 2019-09-02 NOTE — Telephone Encounter (Signed)
Insurance company will need their documentation prior to approving surgery.

## 2019-10-08 NOTE — Telephone Encounter (Signed)
Usually, it would be a Engineer, petroleum. Don't have a specific one in mind. Patient may want to ask around to see who might be best for her.

## 2019-10-23 ENCOUNTER — Encounter (HOSPITAL_COMMUNITY): Payer: Self-pay

## 2019-10-23 ENCOUNTER — Other Ambulatory Visit: Payer: Self-pay

## 2019-10-23 ENCOUNTER — Ambulatory Visit (HOSPITAL_COMMUNITY)
Admission: EM | Admit: 2019-10-23 | Discharge: 2019-10-23 | Disposition: A | Discharge status: Home / Self Care | Payer: 59 | Attending: Physician Assistant | Admitting: Physician Assistant

## 2019-10-23 DIAGNOSIS — J869 Pyothorax without fistula: Secondary | ICD-10-CM

## 2019-10-23 MED ORDER — LIDOCAINE-EPINEPHRINE 1 %-1:100000 IJ SOLN
INTRAMUSCULAR | Status: AC
  Filled 2019-10-23: qty 1

## 2019-10-23 NOTE — Discharge Instructions (Signed)
Return if any problems.

## 2019-10-23 NOTE — ED Triage Notes (Signed)
Pt c/o abscess under left breast x 1 week, on doxycycline

## 2019-10-23 NOTE — ED Provider Notes (Signed)
[facility]    CSN: [id number] Arrival date & time: 10/23/19  2094      History   Chief Complaint Chief Complaint  Patient presents with   Abscess    HPI JALIN ERPELDING is a 24 y.o. female.   The history is provided by the patient. No language interpreter was used.  Abscess Location:  Torso Torso abscess location:  L breast Size:  2 Abscess quality: fluctuance and redness   Progression:  Worsening Chronicity:  New Relieved by:  Nothing Worsened by:  Nothing Ineffective treatments:  None tried Associated symptoms: no vomiting     Past Medical History:  Diagnosis Date   Abscess    Hidradenitis suppurativa 2008    Patient Active Problem List   Diagnosis Date Noted   Folliculitis 06/30/2019   Intertrigo 05/21/2019   Upper back pain 05/21/2019   Hidradenitis suppurativa of right axilla 04/05/2012    History reviewed. No pertinent surgical history.  OB History    Gravida  0   Para  0   Term  0   Preterm  0   AB  0   Living  0     SAB  0   TAB  0   Ectopic  0   Multiple  0   Live Births  0            Home Medications    Prior to Admission medications   Medication Sig Start Date End Date Taking? Authorizing Provider  doxycycline (VIBRA-TABS) 100 MG tablet Take 1 tablet (100 mg total) by mouth 2 (two) times daily. 06/30/19   Mliss Sax, MD  Multiple Vitamin (MULTIVITAMIN) capsule Take 1 capsule by mouth daily.    [provider]  Turmeric (QC TUMERIC COMPLEX PO) Take by mouth. daily    [provider]    Family History Family History  Problem Relation Age of Onset   Diabetes Mother    Hypertension Father     Social History Social History   Tobacco Use   Smoking status: Never Smoker   Smokeless tobacco: Never Used  Building services engineer Use: Never used  Substance Use Topics   Alcohol use: No   Drug use: Not Currently     Allergies   Patient has no known  allergies.   Review of Systems Review of Systems  Gastrointestinal: Negative for vomiting.  All other systems reviewed and are negative.    Physical Exam Triage Vital Signs ED Triage Vitals [10/23/19 0833]  Enc Vitals Group     BP 132/83     Pulse Rate (!) 104     Resp 16     Temp 98 F (36.7 C)     Temp src      SpO2 99 %     Weight      Height      Head Circumference      Peak Flow      Pain Score 8     Pain Loc      Pain Edu?      Excl. in GC?    No data found.  Updated Vital Signs BP 132/83    Pulse (!) 104    Temp 98 F (36.7 C)    Resp 16    LMP 10/05/2019    SpO2 99%   Visual Acuity Right Eye Distance:   Left Eye Distance:   Bilateral Distance:    Right Eye Near:  Left Eye Near:    Bilateral Near:     Physical Exam Vitals reviewed.  Cardiovascular:     Rate and Rhythm: Normal rate.     Pulses: Normal pulses.     Comments: 2cm swollen area under left breast Pulmonary:     Effort: Pulmonary effort is normal.  Musculoskeletal:     Cervical back: Normal range of motion.  Neurological:     Mental Status: She is alert.  Psychiatric:        Mood and Affect: Mood normal.      UC Treatments / Results  Labs (all labs ordered are listed, but only abnormal results are displayed) Labs Reviewed - No data to display  EKG   Radiology No results found.  Procedures Incision and Drainage  Date/Time: 10/23/2019 3:41 PM Performed by: Elson Areas, PA-C Authorized by: Elson Areas, PA-C   Consent:    Consent obtained:  Verbal   Consent given by:  Patient   Risks discussed:  Bleeding and incomplete drainage   Alternatives discussed:  Delayed treatment Location:    Type:  Abscess   Size:  2 Pre-procedure details:    Skin preparation:  Betadine Anesthesia (see MAR for exact dosages):    Anesthesia method:  Local infiltration   Local anesthetic:  Lidocaine 1% w/o epi Procedure type:    Complexity:  Simple Procedure details:    Needle  aspiration: no     Incision types:  Single straight   Scalpel blade:  11   Wound management:  Probed and deloculated   Wound treatment:  Wound left open   Packing materials:  None Post-procedure details:    Patient tolerance of procedure:  Tolerated well, no immediate complications   (including critical care time)  Medications Ordered in UC Medications - No data to display  Initial Impression / Assessment and Plan / UC Course  I have reviewed the triage vital signs and the nursing notes.  Pertinent labs & imaging results that were available during my care of the patient were reviewed by me and considered in my medical decision making (see chart for details).     MDM:  Pt advised to continue current medications  Final Clinical Impressions(s) / UC Diagnoses   Final diagnoses:  Abscess of chest [facility])     Discharge Instructions     Return if any problems.    ED Prescriptions    None     PDMP not reviewed this encounter.  An After Visit Summary was printed and given to the patient.    Elson Areas, [place] 10/23/19 1542

## 2020-04-29 ENCOUNTER — Ambulatory Visit (INDEPENDENT_AMBULATORY_CARE_PROVIDER_SITE_OTHER): Payer: BC Managed Care – PPO | Admitting: Nurse Practitioner

## 2020-04-29 ENCOUNTER — Encounter: Payer: Self-pay | Admitting: Nurse Practitioner

## 2020-04-29 ENCOUNTER — Other Ambulatory Visit: Payer: Self-pay

## 2020-04-29 VITALS — BP 128/88 | HR 82 | Resp 14 | Ht 65.0 in | Wt 185.0 lb

## 2020-04-29 DIAGNOSIS — Z01419 Encounter for gynecological examination (general) (routine) without abnormal findings: Secondary | ICD-10-CM | POA: Diagnosis not present

## 2020-04-29 NOTE — Progress Notes (Signed)
   Melissa Fisher January 24, 1996 [id number]   History:  25 y.o. G0 presents for annual exam without GYN complaints. Monthly cycle. Normal pap history. Gardasil series completed. Not currently sexually active. History of hydradenitis suppurative managed with weight loss (down 20 pounds), diet, and antibiotic ointments as needed - followed by dermatology.   Gynecologic History Patient's last menstrual period was 04/17/2020. Period Cycle (Days): 28 Period Duration (Days): 4-5 Period Pattern: Regular Menstrual Flow: Heavy,Moderate Menstrual Control: Tampon,Maxi pad Menstrual Control Change Freq (Hours): changes pad/tampon every 2-3 hours Dysmenorrhea: (!) Mild Dysmenorrhea Symptoms: Nausea,Cramping Contraception/Family planning: abstinence  Health Maintenance Last Pap: 04/23/2019. Results were: normal Last mammogram: N/A Last colonoscopy: N/A  Last Dexa: N/A  Past medical history, past surgical history, family history and social history were all reviewed and documented in the EPIC chart.  ROS:  A ROS was performed and pertinent positives and negatives are included.  Exam:  Vitals:   04/29/20 0830  BP: 128/88  Pulse: 82  Resp: 14  Weight: 185 lb (83.9 kg)  Height: 5\' 5"  (1.651 m)   Body mass index is 30.79 kg/m.  General appearance:  Normal Thyroid:  Symmetrical, normal in size, without palpable masses or nodularity. Respiratory  Auscultation:  Clear without wheezing or rhonchi Cardiovascular  Auscultation:  Regular rate, without rubs, murmurs or gallops  Edema/varicosities:  Not grossly evident Abdominal  Soft,nontender, without masses, guarding or rebound.  Liver/spleen:  No organomegaly noted  Hernia:  None appreciated  Skin  Inspection:  Grossly normal. Scarring on breasts and groin/thighs from HS Breasts: Examined lying and sitting.   Right: Without masses, retractions, discharge or axillary adenopathy.   Left: Without masses, retractions, discharge or axillary  adenopathy. Gentitourinary   Inguinal/mons:  Normal without inguinal adenopathy  External genitalia:  Normal  BUS/Urethra/Skene's glands:  Normal  Vagina:  Normal  Cervix:  Normal  Uterus:  Normal in size, shape and contour.  Midline and mobile  Adnexa/parametria:     Rt: Without masses or tenderness.   Lt: Without masses or tenderness.  Anus and perineum: Normal  Assessment/Plan:  25 y.o. G0 for annual exam.   Well female exam with routine gynecological exam - Education provided on SBEs, importance of preventative screenings, current guidelines, high calcium diet, regular exercise, and multivitamin daily. Congratulated on weight loss.   Screening for cervical cancer - Normal pap history. Will repeat at 3-year interval.  Follow up in 1 year for annual.    [facility], 8:40 AM 04/29/2020

## 2020-04-29 NOTE — Patient Instructions (Signed)
Health Maintenance, Female Adopting a healthy lifestyle and getting preventive care are important in promoting health and wellness. Ask your health care provider about:  The right schedule for you to have regular tests and exams.  Things you can do on your own to prevent diseases and keep yourself healthy. What should I know about diet, weight, and exercise? Eat a healthy diet  Eat a diet that includes plenty of vegetables, fruits, low-fat dairy products, and lean protein.  Do not eat a lot of foods that are high in solid fats, added sugars, or sodium.   Maintain a healthy weight Body mass index (BMI) is used to identify weight problems. It estimates body fat based on height and weight. Your health care provider can help determine your BMI and help you achieve or maintain a healthy weight. Get regular exercise Get regular exercise. This is one of the most important things you can do for your health. Most adults should:  Exercise for at least 150 minutes each week. The exercise should increase your heart rate and make you sweat (moderate-intensity exercise).  Do strengthening exercises at least twice a week. This is in addition to the moderate-intensity exercise.  Spend less time sitting. Even light physical activity can be beneficial. Watch cholesterol and blood lipids Have your blood tested for lipids and cholesterol at 25 years of age, then have this test every 5 years. Have your cholesterol levels checked more often if:  Your lipid or cholesterol levels are high.  You are older than 25 years of age.  You are at high risk for heart disease. What should I know about cancer screening? Depending on your health history and family history, you may need to have cancer screening at various ages. This may include screening for:  Breast cancer.  Cervical cancer.  Colorectal cancer.  Skin cancer.  Lung cancer. What should I know about heart disease, diabetes, and high blood  pressure? Blood pressure and heart disease  High blood pressure causes heart disease and increases the risk of stroke. This is more likely to develop in people who have high blood pressure readings, are of African descent, or are overweight.  Have your blood pressure checked: ? Every 3-5 years if you are 18-39 years of age. ? Every year if you are 40 years old or older. Diabetes Have regular diabetes screenings. This checks your fasting blood sugar level. Have the screening done:  Once every three years after age 40 if you are at a normal weight and have a low risk for diabetes.  More often and at a younger age if you are overweight or have a high risk for diabetes. What should I know about preventing infection? Hepatitis B If you have a higher risk for hepatitis B, you should be screened for this virus. Talk with your health care provider to find out if you are at risk for hepatitis B infection. Hepatitis C Testing is recommended for:  Everyone born from 1945 through 1965.  Anyone with known risk factors for hepatitis C. Sexually transmitted infections (STIs)  Get screened for STIs, including gonorrhea and chlamydia, if: ? You are sexually active and are younger than 24 years of age. ? You are older than 24 years of age and your health care provider tells you that you are at risk for this type of infection. ? Your sexual activity has changed since you were last screened, and you are at increased risk for chlamydia or gonorrhea. Ask your health care provider   if you are at risk.  Ask your health care provider about whether you are at high risk for HIV. Your health care provider may recommend a prescription medicine to help prevent HIV infection. If you choose to take medicine to prevent HIV, you should first get tested for HIV. You should then be tested every 3 months for as long as you are taking the medicine. Pregnancy  If you are about to stop having your period (premenopausal) and  you may become pregnant, seek counseling before you get pregnant.  Take 400 to 800 micrograms (mcg) of folic acid every day if you become pregnant.  Ask for birth control (contraception) if you want to prevent pregnancy. Osteoporosis and menopause Osteoporosis is a disease in which the bones lose minerals and strength with aging. This can result in bone fractures. If you are 65 years old or older, or if you are at risk for osteoporosis and fractures, ask your health care provider if you should:  Be screened for bone loss.  Take a calcium or vitamin D supplement to lower your risk of fractures.  Be given hormone replacement therapy (HRT) to treat symptoms of menopause. Follow these instructions at home: Lifestyle  Do not use any products that contain nicotine or tobacco, such as cigarettes, e-cigarettes, and chewing tobacco. If you need help quitting, ask your health care provider.  Do not use street drugs.  Do not share needles.  Ask your health care provider for help if you need support or information about quitting drugs. Alcohol use  Do not drink alcohol if: ? Your health care provider tells you not to drink. ? You are pregnant, may be pregnant, or are planning to become pregnant.  If you drink alcohol: ? Limit how much you use to 0-1 drink a day. ? Limit intake if you are breastfeeding.  Be aware of how much alcohol is in your drink. In the [place] one drink equals one 12 oz bottle of beer (355 mL), one 5 oz glass of wine (148 mL), or one 1 oz glass of hard liquor (44 mL). General instructions  Schedule regular health, dental, and eye exams.  Stay current with your vaccines.  Tell your health care provider if: ? You often feel depressed. ? You have ever been abused or do not feel safe at home. Summary  Adopting a healthy lifestyle and getting preventive care are important in promoting health and wellness.  Follow your health care provider's instructions about healthy  diet, exercising, and getting tested or screened for diseases.  Follow your health care provider's instructions on monitoring your cholesterol and blood pressure. This information is not intended to replace advice given to you by your health care provider. Make sure you discuss any questions you have with your health care provider. Document Revised: 02/13/2018 Document Reviewed: 02/13/2018 Elsevier Patient Education  2021 Elsevier Inc.  

## 2020-06-14 ENCOUNTER — Ambulatory Visit (INDEPENDENT_AMBULATORY_CARE_PROVIDER_SITE_OTHER): Payer: BC Managed Care – PPO | Admitting: Nurse Practitioner

## 2020-06-14 ENCOUNTER — Other Ambulatory Visit: Payer: Self-pay

## 2020-06-14 ENCOUNTER — Encounter: Payer: Self-pay | Admitting: Nurse Practitioner

## 2020-06-14 VITALS — BP 116/74

## 2020-06-14 DIAGNOSIS — Z113 Encounter for screening for infections with a predominantly sexual mode of transmission: Secondary | ICD-10-CM | POA: Diagnosis not present

## 2020-06-14 DIAGNOSIS — L680 Hirsutism: Secondary | ICD-10-CM

## 2020-06-14 NOTE — Progress Notes (Signed)
   Acute Office Visit  Subjective:    Patient ID: Melissa Fisher, female    DOB: Jun 25, 1995, 25 y.o.   MRN: [id number]   HPI 25 y.o. presents today for STD screening. She had unprotected intercourse 4 days ago. Denies any vaginal symptoms. LMP 06/03/2020. She also complains of facial hair growth over the last year and is wondering if she has PCOS. Cycles are regular and only vary by a few days each month.    Review of Systems  Constitutional: Negative.   Genitourinary: Negative.   Skin:       Facial hair       Objective:    Physical Exam Constitutional:      Appearance: Normal appearance.  Genitourinary:    General: Normal vulva.     Vagina: Normal.     Cervix: Normal.     BP 116/74   LMP 06/03/2020  Wt Readings from Last 3 Encounters:  04/29/20 185 lb (83.9 kg)  06/30/19 206 lb 12.8 oz (93.8 kg)  06/06/19 204 lb (92.5 kg)        Assessment & Plan:   Problem List Items Addressed This Visit   None   Visit Diagnoses    Screen for STD (sexually transmitted disease)    -  Primary   Relevant Orders   C. trachomatis/N. gonorrhoeae RNA   HIV Antibody (routine testing w rflx)   RPR   Hirsutism       Relevant Orders   Testos,Total,Free and SHBG (Female)     Plan: Chlamydia/gonorrhea pending. She is aware it is too soon to test for HIV/RPR and will return in 2-3 weeks for blood work. We discussed PCOS and the classic symptoms of menstrual dysfunction, hyperandrogenism, and/or polycystic ovaries. We will check female testosterone panel to identify Idiopathic hirsutism versus PCOS. All questions answered. She is agreeable to plan.     Olivia Mackie DNP, 2:42 PM 06/14/2020

## 2020-06-15 ENCOUNTER — Other Ambulatory Visit: Payer: Self-pay | Admitting: Nurse Practitioner

## 2020-06-15 DIAGNOSIS — A749 Chlamydial infection, unspecified: Secondary | ICD-10-CM

## 2020-06-15 LAB — C. TRACHOMATIS/N. GONORRHOEAE RNA
C. trachomatis RNA, TMA: DETECTED — AB
N. gonorrhoeae RNA, TMA: NOT DETECTED

## 2020-06-15 MED ORDER — DOXYCYCLINE MONOHYDRATE 100 MG PO CAPS: 100.0000 mg | ORAL_CAPSULE | Freq: Two times a day (BID) | ORAL | 0 refills | Status: AC

## 2020-06-16 ENCOUNTER — Telehealth: Payer: Self-pay | Admitting: *Deleted

## 2020-06-16 NOTE — Telephone Encounter (Signed)
CDC recommends 3 months if someone is sexually active to check for reinfection. If she is not sexually active she can return in 3-4 weeks for TOC but no sooner.

## 2020-06-16 NOTE — Telephone Encounter (Signed)
Patient informed with below note, she would like to schedule something in the next 3-4 weeks. Will have appointments call to schedule.  Patient aware no sexually intercourse and she can return in 3-4 weeks.

## 2020-06-16 NOTE — Telephone Encounter (Signed)
Patient tested positive for chlamydia on 06/15/20 told to come back in 3 months for recheck. Patient asked if she can come sooner for [facility] recheck? She has appointment scheduled on 07/05/20 for STD blood test, asked if she could have TOC done then?

## 2020-07-05 ENCOUNTER — Other Ambulatory Visit: Payer: BC Managed Care – PPO

## 2020-07-05 ENCOUNTER — Ambulatory Visit (INDEPENDENT_AMBULATORY_CARE_PROVIDER_SITE_OTHER): Payer: BC Managed Care – PPO | Admitting: Nurse Practitioner

## 2020-07-05 ENCOUNTER — Other Ambulatory Visit: Payer: Self-pay

## 2020-07-05 ENCOUNTER — Encounter: Payer: Self-pay | Admitting: Nurse Practitioner

## 2020-07-05 VITALS — BP 118/74

## 2020-07-05 DIAGNOSIS — Z113 Encounter for screening for infections with a predominantly sexual mode of transmission: Secondary | ICD-10-CM

## 2020-07-05 DIAGNOSIS — A749 Chlamydial infection, unspecified: Secondary | ICD-10-CM | POA: Diagnosis not present

## 2020-07-05 DIAGNOSIS — L680 Hirsutism: Secondary | ICD-10-CM

## 2020-07-05 NOTE — Progress Notes (Signed)
   Acute Office Visit  Subjective:    Patient ID: [name], female    DOB: [birth year], 25 y.o.   MRN: [id number]   HPI 25 y.o. presents today for [facility] and lab work (HIV,RPR, testosterone) from last visit. Positive for chlamydia 06/14/2020. Not sexually active.    Review of Systems  Constitutional: Negative.   Genitourinary: Negative.        Objective:    Physical Exam Constitutional:      Appearance: Normal appearance.  Genitourinary:    General: Normal vulva.     Vagina: Normal.     Cervix: Normal.     BP 118/74   LMP 06/28/2020  Wt Readings from Last 3 Encounters:  04/29/20 185 lb (83.9 kg)  06/30/19 206 lb 12.8 oz (93.8 kg)  06/06/19 204 lb (92.5 kg)        Assessment & Plan:   Problem List Items Addressed This Visit   None   Visit Diagnoses    Chlamydia infection    -  Primary   Relevant Orders   C. trachomatis/N. gonorrhoeae RNA     Plan: GC/Chlamydia pending, will triage based on results. We again discussed safe sex practices and condom use until permanent partner. We discussed spironolactone for hirsutism if testosterone elevated. She is agreeable.      Olivia Mackie DNP, 10:10 AM 07/05/2020

## 2020-07-06 LAB — C. TRACHOMATIS/N. GONORRHOEAE RNA
C. trachomatis RNA, TMA: NOT DETECTED
N. gonorrhoeae RNA, TMA: NOT DETECTED

## 2020-07-07 ENCOUNTER — Other Ambulatory Visit: Payer: Self-pay

## 2020-07-07 ENCOUNTER — Encounter: Payer: Self-pay | Admitting: Nurse Practitioner

## 2020-07-08 ENCOUNTER — Ambulatory Visit: Payer: BC Managed Care – PPO | Admitting: Family Medicine

## 2020-07-08 ENCOUNTER — Encounter: Payer: Self-pay | Admitting: Family Medicine

## 2020-07-08 VITALS — BP 110/78 | HR 81 | Temp 97.2°F | Ht 65.0 in | Wt 183.0 lb

## 2020-07-08 DIAGNOSIS — L720 Epidermal cyst: Secondary | ICD-10-CM | POA: Insufficient documentation

## 2020-07-08 DIAGNOSIS — N6489 Other specified disorders of breast: Secondary | ICD-10-CM | POA: Diagnosis not present

## 2020-07-08 DIAGNOSIS — L732 Hidradenitis suppurativa: Secondary | ICD-10-CM

## 2020-07-08 DIAGNOSIS — M549 Dorsalgia, unspecified: Secondary | ICD-10-CM

## 2020-07-08 DIAGNOSIS — L304 Erythema intertrigo: Secondary | ICD-10-CM | POA: Diagnosis not present

## 2020-07-08 LAB — TESTOS,TOTAL,FREE AND SHBG (FEMALE)
Free Testosterone: 1.6 pg/mL (ref 0.1–6.4)
Sex Hormone Binding: 66 nmol/L (ref 17–124)
Testosterone, Total, LC-MS-MS: 19 ng/dL (ref 2–45)

## 2020-07-08 LAB — HIV ANTIBODY (ROUTINE TESTING W REFLEX): HIV 1&2 Ab, 4th Generation: NONREACTIVE

## 2020-07-08 LAB — RPR: RPR Ser Ql: NONREACTIVE

## 2020-07-08 MED ORDER — DOXYCYCLINE HYCLATE 100 MG PO TABS: 100.0000 mg | ORAL_TABLET | Freq: Two times a day (BID) | ORAL | 0 refills | Status: DC

## 2020-07-08 NOTE — Progress Notes (Signed)
Established Patient Office Visit  Subjective:  Patient ID: Melissa Fisher, female    DOB: 06-27-1995  Age: 25 y.o. MRN: [id number]  CC:  Chief Complaint  Patient presents with  . Follow-up    Patient here for medical clearance to have breast reduction.     HPI Melissa Fisher presents for follow-up of her quest for surgical reduction of her pendulous breasts.  Patient is actually lost 23 pounds since her last visit.  Her breast size has not decreased.  Her breasts have actually become longer and she is wearing the same bra.  She is seeing her chiropractor who agrees that the breast reduction may help relieve her upper back pain.  Dermatology concurs that breast reduction the hidradenitis she has been dealing with under her breast.  She has a lesion underneath the left breast that has not responded to topical antibiotic therapy.  It is had purulent discharge.  Past Medical History:  Diagnosis Date  . Abscess   . Hidradenitis suppurativa 2008    No past surgical history on file.  Family History  Problem Relation Age of Onset  . Diabetes Mother   . Hypertension Father     Social History   Socioeconomic History  . Marital status: Single    Spouse name: Not on file  . Number of children: Not on file  . Years of education: Not on file  . Highest education level: Not on file  Occupational History  . Not on file  Tobacco Use  . Smoking status: Never Smoker  . Smokeless tobacco: Never Used  Vaping Use  . Vaping Use: Never used  Substance and Sexual Activity  . Alcohol use: No  . Drug use: Not Currently  . Sexual activity: Not Currently    Birth control/protection: None    Comment: intercourse age 18, less than 5 sexual partners  Other Topics Concern  . Not on file  Social History Narrative  . Not on file   Social Determinants of Health   Financial Resource Strain: Not on file  Food Insecurity: Not on file  Transportation Needs: Not on file  Physical Activity:  Not on file  Stress: Not on file  Social Connections: Not on file  Intimate Partner Violence: Not on file    Outpatient Medications Prior to Visit  Medication Sig Dispense Refill  . Multiple Vitamin (MULTIVITAMIN) capsule Take 1 capsule by mouth daily.     No facility-administered medications prior to visit.    No Known Allergies  ROS Review of Systems  Constitutional: Negative.   Respiratory: Negative.   Cardiovascular: Negative.   Gastrointestinal: Negative.   Musculoskeletal: Positive for back pain.  Skin: Positive for rash and wound.  Psychiatric/Behavioral: Negative.       Objective:    Physical Exam Vitals and nursing note reviewed.  Constitutional:      General: She is not in acute distress.    Appearance: Normal appearance. She is not ill-appearing, toxic-appearing or diaphoretic.  HENT:     Head: Normocephalic and atraumatic.  Eyes:     General: No scleral icterus.       Right eye: No discharge.        Left eye: No discharge.     Conjunctiva/sclera: Conjunctivae normal.     Pupils: Pupils are equal, round, and reactive to light.  Cardiovascular:     Rate and Rhythm: Normal rate and regular rhythm.  Pulmonary:     Effort: Pulmonary effort is normal.  Breath sounds: Normal breath sounds.  Musculoskeletal:     Cervical back: No rigidity or tenderness.  Lymphadenopathy:     Cervical: No cervical adenopathy.  Skin:      Neurological:     Mental Status: She is alert.     BP 110/78   Pulse 81   Temp (!) 97.2 F (36.2 C) (Temporal)   Ht 5\' 5"  (1.651 m)   Wt 183 lb (83 kg)   LMP 06/28/2020   SpO2 99%   BMI 30.45 kg/m  Wt Readings from Last 3 Encounters:  07/08/20 183 lb (83 kg)  04/29/20 185 lb (83.9 kg)  06/30/19 206 lb 12.8 oz (93.8 kg)     Health Maintenance Due  Topic Date Due  . Hepatitis C Screening  Never done  . HPV VACCINES (1 - 2-dose series) Never done  . PAP SMEAR-Modifier  Never done       Topic Date Due  . HPV  VACCINES (1 - 2-dose series) Never done    No results found for: TSH Lab Results  Component Value Date   WBC 5.1 03/18/2019   HGB 13.5 03/18/2019   HCT 38.7 03/18/2019   MCV 94.1 03/18/2019   PLT 347.0 03/18/2019   Lab Results  Component Value Date   NA 136 03/18/2019   K 3.6 03/18/2019   CO2 27 03/18/2019   GLUCOSE 91 03/18/2019   BUN 16 03/18/2019   CREATININE 0.88 03/18/2019   BILITOT 0.8 03/18/2019   ALKPHOS 53 03/18/2019   AST 15 03/18/2019   ALT 10 03/18/2019   PROT 7.1 03/18/2019   ALBUMIN 4.3 03/18/2019   CALCIUM 9.4 03/18/2019   GFR 96.08 03/18/2019   Lab Results  Component Value Date   CHOL 155 03/18/2019   Lab Results  Component Value Date   HDL 45.30 03/18/2019   Lab Results  Component Value Date   LDLCALC 100 (H) 03/18/2019   Lab Results  Component Value Date   TRIG 49.0 03/18/2019   Lab Results  Component Value Date   CHOLHDL 3 03/18/2019   No results found for: HGBA1C    Assessment & Plan:   Problem List Items Addressed This Visit      Musculoskeletal and Integument   Suppurative hidradenitis   Intertrigo     Other   Upper back pain - Primary   Bilateral pendulous breasts   Inclusion cyst   Relevant Medications   doxycycline (VIBRA-TABS) 100 MG tablet   Other Relevant Orders   Wound culture      Meds ordered this encounter  Medications  . doxycycline (VIBRA-TABS) 100 MG tablet    Sig: Take 1 tablet (100 mg total) by mouth 2 (two) times daily.    Dispense:  20 tablet    Refill:  0    Follow-up: Return Return in 2 weeks if chest wall lesion does not heal completely after doxycycline., for And will consider dermatology follow-up if lesion does not heal properly with doxycycline..  Patient is seeking breast reduction surgery for her pendulous breast.  She has lost 23 pounds and this is not helped.  She tells me that she carries supporting documentation from her chiropractor and dermatologist and support of her desire to  undergo breast reduction surgery.  I think that she could benefit from breast reduction surgery.  05/16/2019, MD

## 2020-07-13 LAB — WOUND CULTURE
MICRO NUMBER:: 11859946
RESULT:: NO GROWTH
SPECIMEN QUALITY:: ADEQUATE

## 2020-09-13 ENCOUNTER — Ambulatory Visit: Payer: BC Managed Care – PPO | Admitting: Nurse Practitioner

## 2020-09-24 ENCOUNTER — Other Ambulatory Visit: Payer: Self-pay | Admitting: Family Medicine

## 2020-09-24 DIAGNOSIS — L720 Epidermal cyst: Secondary | ICD-10-CM

## 2020-09-24 MED ORDER — DOXYCYCLINE HYCLATE 100 MG PO TABS: 100.0000 mg | ORAL_TABLET | Freq: Two times a day (BID) | ORAL | 0 refills | Status: DC

## 2020-09-24 NOTE — Telephone Encounter (Signed)
Please advise message below patient calling for a refill on pending medication per patient she tends to have these flare ups due to sweating a lot under her breat. She states that she will call and schedule an appointment with dr. Doreene Burke to take another look at this.

## 2020-09-24 NOTE — Telephone Encounter (Signed)
Reviewed pts chart including progress note from last OV with PCP. Med refilled and agree with f/u appt with PCP

## 2020-09-24 NOTE — Telephone Encounter (Signed)
Pt has skin condition and is having a flare up and needs a refill on her doxycycline (VIBRA-TABS) 100 MG tablet asap. Please advise. Sending for doc of the day as well since Dr Doreene Burke is out

## 2021-01-11 ENCOUNTER — Ambulatory Visit (HOSPITAL_COMMUNITY)
Admission: RE | Admit: 2021-01-11 | Discharge: 2021-01-11 | Disposition: A | Discharge status: Home / Self Care | Payer: BC Managed Care – PPO | Source: Ambulatory Visit | Attending: Emergency Medicine | Admitting: Emergency Medicine

## 2021-01-11 ENCOUNTER — Other Ambulatory Visit: Payer: Self-pay

## 2021-01-11 ENCOUNTER — Encounter (HOSPITAL_COMMUNITY): Payer: Self-pay

## 2021-01-11 VITALS — BP 111/68 | HR 75 | Temp 98.8°F | Resp 19

## 2021-01-11 DIAGNOSIS — J309 Allergic rhinitis, unspecified: Secondary | ICD-10-CM | POA: Diagnosis not present

## 2021-01-11 DIAGNOSIS — R052 Subacute cough: Secondary | ICD-10-CM

## 2021-01-11 MED ORDER — PREDNISONE 50 MG PO TABS: 50.0000 mg | ORAL_TABLET | Freq: Every day | ORAL | 0 refills | Status: DC

## 2021-01-11 MED ORDER — ALBUTEROL SULFATE HFA 108 (90 BASE) MCG/ACT IN AERS: 2.0000 | INHALATION_SPRAY | RESPIRATORY_TRACT | 1 refills | Status: AC | PRN

## 2021-01-11 MED ORDER — FLUTICASONE PROPIONATE 50 MCG/ACT NA SUSP: 2.0000 | Freq: Every day | NASAL | 2 refills | Status: AC

## 2021-01-11 NOTE — ED Provider Notes (Signed)
[facility]    CSN: [id number] Arrival date & time: 01/11/21  1430      History   Chief Complaint Chief Complaint  Patient presents with   Cough    HPI Melissa Fisher is a 25 y.o. female.  Patient has had a URI about a month ago and completely recovered from it with the exception of a lingering cough.  Cough is worse at night.  Feels like she is wheezing sometimes.  She has been using Mucinex for minimal relief.  She has used an albuterol inhaler in the past and would like 1 prescribed now    Cough Associated symptoms: shortness of breath and wheezing   Associated symptoms: no chills, no fever and no sore throat    Past Medical History:  Diagnosis Date   Abscess    Hidradenitis suppurativa 2008    Patient Active Problem List   Diagnosis Date Noted   Bilateral pendulous breasts 07/08/2020   Inclusion cyst 07/08/2020   Folliculitis 06/30/2019   Intertrigo 05/21/2019   Upper back pain 05/21/2019   Suppurative hidradenitis 04/05/2012    History reviewed. No pertinent surgical history.  OB History     Gravida  0   Para  0   Term  0   Preterm  0   AB  0   Living  0      SAB  0   IAB  0   Ectopic  0   Multiple  0   Live Births  0            Home Medications    Prior to Admission medications   Medication Sig Start Date End Date Taking? Authorizing Provider  albuterol (VENTOLIN HFA) 108 (90 Base) MCG/ACT inhaler Inhale 2 puffs into the lungs every 4 (four) hours as needed for wheezing or shortness of breath. 01/11/21  Yes Cathlyn Parsons, NP  fluticasone (FLONASE) 50 MCG/ACT nasal spray Place 2 sprays into both nostrils daily. 01/11/21  Yes Cathlyn Parsons, NP  predniSONE (DELTASONE) 50 MG tablet Take 1 tablet (50 mg total) by mouth daily with breakfast. 01/11/21  Yes Cathlyn Parsons, NP  doxycycline (VIBRA-TABS) 100 MG tablet Take 1 tablet (100 mg total) by mouth 2 (two) times daily. 09/24/20   Overton Mam, DO  Multiple  Vitamin (MULTIVITAMIN) capsule Take 1 capsule by mouth daily.    [provider]    Family History Family History  Problem Relation Age of Onset   Diabetes Mother    Hypertension Father     Social History Social History   Tobacco Use   Smoking status: Never   Smokeless tobacco: Never  Vaping Use   Vaping Use: Never used  Substance Use Topics   Alcohol use: No   Drug use: Not Currently     Allergies   Patient has no known allergies.   Review of Systems Review of Systems  Constitutional:  Negative for chills and fever.  HENT:  Negative for congestion, postnasal drip and sore throat.   Respiratory:  Positive for cough, shortness of breath and wheezing.     Physical Exam Triage Vital Signs ED Triage Vitals  Enc Vitals Group     BP 01/11/21 1508 111/68     Pulse Rate 01/11/21 1508 75     Resp 01/11/21 1508 19     Temp 01/11/21 1508 98.8 F (37.1 C)     Temp Source 01/11/21 1508 Oral     SpO2  01/11/21 1508 100 %     Weight --      Height --      Head Circumference --      Peak Flow --      Pain Score 01/11/21 1507 0     Pain Loc --      Pain Edu? --      Excl. in GC? --    No data found.  Updated Vital Signs BP 111/68 (BP Location: Right Arm)   Pulse 75   Temp 98.8 F (37.1 C) (Oral)   Resp 19   LMP 12/22/2020 (Exact Date)   SpO2 100%   Visual Acuity Right Eye Distance:   Left Eye Distance:   Bilateral Distance:    Right Eye Near:   Left Eye Near:    Bilateral Near:     Physical Exam Constitutional:      Appearance: Normal appearance. She is not ill-appearing.  HENT:     Right Ear: Tympanic membrane, ear canal and external ear normal.     Left Ear: Tympanic membrane, ear canal and external ear normal.     Nose: Congestion and rhinorrhea present.  Cardiovascular:     Rate and Rhythm: Normal rate and regular rhythm.  Pulmonary:     Effort: Pulmonary effort is normal.     Breath sounds: Normal breath sounds.  Neurological:      Mental Status: She is alert.     UC Treatments / Results  Labs (all labs ordered are listed, but only abnormal results are displayed) Labs Reviewed - No data to display  EKG   Radiology No results found.  Procedures Procedures (including critical care time)  Medications Ordered in UC Medications - No data to display  Initial Impression / Assessment and Plan / UC Course  I have reviewed the triage vital signs and the nursing notes.  Pertinent labs & imaging results that were available during my care of the patient were reviewed by me and considered in my medical decision making (see chart for details).    Although patient's lungs are clear at this time, she does report having wheezing at times at home.  Prescribed albuterol inhaler and prednisone.  I suspect some of her symptoms may be related to untreated allergic rhinitis.  She has a history of allergic rhinitis but has not been taking any meds for this.  Prescribed Flonase.  Final Clinical Impressions(s) / UC Diagnoses   Final diagnoses:  Subacute cough  Allergic rhinitis, unspecified seasonality, unspecified trigger     Discharge Instructions      Continue to use the Mucinex DM if needed to control your cough so you can sleep at night.  You can use the albuterol inhaler 2 puffs every 4-6 hours as needed for shortness of breath, wheezing, or cough.       ED Prescriptions     Medication Sig Dispense Auth. Provider   albuterol (VENTOLIN HFA) 108 (90 Base) MCG/ACT inhaler Inhale 2 puffs into the lungs every 4 (four) hours as needed for wheezing or shortness of breath. 1 each Cathlyn Parsons, NP   predniSONE (DELTASONE) 50 MG tablet Take 1 tablet (50 mg total) by mouth daily with breakfast. 5 tablet Cathlyn Parsons, NP   fluticasone (FLONASE) 50 MCG/ACT nasal spray Place 2 sprays into both nostrils daily. 16 g Cathlyn Parsons, NP      PDMP not reviewed this encounter.   Cathlyn Parsons, NP 01/19/21 [phone]

## 2021-01-11 NOTE — Discharge Instructions (Signed)
Continue to use the Mucinex DM if needed to control your cough so you can sleep at night.  You can use the albuterol inhaler 2 puffs every 4-6 hours as needed for shortness of breath, wheezing, or cough.

## 2021-01-11 NOTE — ED Triage Notes (Signed)
Pt presents with a productive cough, headache, fatigue and SOB x 3 weeks. States she needs an inhaler and states mucinex has helped some.

## 2021-01-14 ENCOUNTER — Telehealth: Payer: Self-pay | Admitting: Family Medicine

## 2021-01-14 NOTE — Telephone Encounter (Signed)
Called patient to see if she was requesting a refill for inhaler that was just filled last week. Per patient she wanted to know if she could have a refill available due to how frequent she's having to use the one that she currently has. Informed patient that she will not be able to pick up refill if it has not been a month since she had rx filled. Patient verbally understood if she is using inhaler more frequent she will need to schedule an appointment for follow up.

## 2021-02-17 ENCOUNTER — Other Ambulatory Visit: Payer: Self-pay

## 2021-02-17 ENCOUNTER — Encounter: Payer: Self-pay | Admitting: Nurse Practitioner

## 2021-02-17 ENCOUNTER — Ambulatory Visit (INDEPENDENT_AMBULATORY_CARE_PROVIDER_SITE_OTHER): Payer: BC Managed Care – PPO | Admitting: Nurse Practitioner

## 2021-02-17 VITALS — BP 104/68 | HR 81 | Resp 18

## 2021-02-17 DIAGNOSIS — B9689 Other specified bacterial agents as the cause of diseases classified elsewhere: Secondary | ICD-10-CM

## 2021-02-17 DIAGNOSIS — N898 Other specified noninflammatory disorders of vagina: Secondary | ICD-10-CM | POA: Diagnosis not present

## 2021-02-17 DIAGNOSIS — N760 Acute vaginitis: Secondary | ICD-10-CM

## 2021-02-17 DIAGNOSIS — Z113 Encounter for screening for infections with a predominantly sexual mode of transmission: Secondary | ICD-10-CM | POA: Diagnosis not present

## 2021-02-17 LAB — WET PREP FOR TRICH, YEAST, CLUE

## 2021-02-17 MED ORDER — METRONIDAZOLE 500 MG PO TABS: 500.0000 mg | ORAL_TABLET | Freq: Two times a day (BID) | ORAL | 0 refills | Status: DC

## 2021-02-17 NOTE — Progress Notes (Signed)
° °  Acute Office Visit  Subjective:    Patient ID: Melissa Fisher, female    DOB: 01-16-1996, 25 y.o.   MRN: [id number]   HPI 25 y.o. presents today for fishy vaginal odor and white discharge that started 3 weeks ago after intercourse. She used boric acid suppositories and had improvement in odor but then it returned after having intercourse again. Denies vaginal itching or irritation.    Review of Systems  Constitutional: Negative.   Genitourinary:  Positive for vaginal discharge. Negative for dyspareunia, genital sores and vaginal pain.       Vaginal odor      Objective:    Physical Exam Constitutional:      Appearance: Normal appearance.  Genitourinary:    General: Normal vulva.     Vagina: Vaginal discharge present. No erythema.     Cervix: Normal.    BP 104/68 (BP Location: Right Arm)    Pulse 81    Resp 18    LMP 02/12/2021 (Approximate)    SpO2 99%  Wt Readings from Last 3 Encounters:  07/08/20 183 lb (83 kg)  04/29/20 185 lb (83.9 kg)  06/30/19 206 lb 12.8 oz (93.8 kg)   Wet prep + clue cells + odor     Assessment & Plan:   Problem List Items Addressed This Visit   None Visit Diagnoses     Bacterial vaginosis    -  Primary   Relevant Medications   metroNIDAZOLE (FLAGYL) 500 MG tablet   Vaginal odor       Relevant Orders   WET PREP FOR TRICH, YEAST, CLUE   Screen for STD (sexually transmitted disease)       Relevant Orders   SURESWAB CT/NG/T. vaginalis   RPR   HIV Antibody (routine testing w rflx)      Plan: Wet prep positive for clue cells - Flagyl 500 mg BID x 7 days. STD panel pending.      Olivia Mackie DNP, 12:17 PM 02/17/2021

## 2021-02-18 LAB — HIV ANTIBODY (ROUTINE TESTING W REFLEX): HIV 1&2 Ab, 4th Generation: NONREACTIVE

## 2021-02-18 LAB — RPR: RPR Ser Ql: NONREACTIVE

## 2021-02-21 LAB — SURESWAB CT/NG/T. VAGINALIS
C. trachomatis RNA, TMA: NOT DETECTED
N. gonorrhoeae RNA, TMA: NOT DETECTED
Trichomonas vaginalis RNA: NOT DETECTED

## 2021-04-04 ENCOUNTER — Encounter (HOSPITAL_COMMUNITY): Payer: Self-pay

## 2021-04-04 ENCOUNTER — Other Ambulatory Visit: Payer: Self-pay

## 2021-04-04 ENCOUNTER — Ambulatory Visit (HOSPITAL_COMMUNITY)
Admission: RE | Admit: 2021-04-04 | Discharge: 2021-04-04 | Disposition: A | Discharge status: Home / Self Care | Payer: BC Managed Care – PPO | Source: Ambulatory Visit | Attending: Internal Medicine | Admitting: Internal Medicine

## 2021-04-04 VITALS — BP 118/76 | HR 79 | Temp 98.7°F | Resp 16 | Ht 65.0 in | Wt 183.0 lb

## 2021-04-04 DIAGNOSIS — S83422A Sprain of lateral collateral ligament of left knee, initial encounter: Secondary | ICD-10-CM

## 2021-04-04 MED ORDER — IBUPROFEN 600 MG PO TABS: 600.0000 mg | ORAL_TABLET | Freq: Four times a day (QID) | ORAL | 0 refills | Status: DC | PRN

## 2021-04-04 NOTE — Discharge Instructions (Signed)
Entered range of motion exercises Icing of the left knee Take medications as prescribed Return to urgent care if symptoms worsen.

## 2021-04-04 NOTE — ED Triage Notes (Signed)
Pt reports falling while ice skating Saturday causing injury to her left knee. Pt states she is unable to bend her knee and it hurts to walk.   Also reports episode of dizziness, heart racing and nausea with one episode of vomiting yesterday however symptoms resolved after vomiting.

## 2021-04-04 NOTE — ED Provider Notes (Signed)
[facility]    CSN: [id number] Arrival date & time: 04/04/21  1001      History   Chief Complaint Chief Complaint  Patient presents with   Knee Pain    HPI Melissa Fisher is a 26 y.o. female comes to the urgent care with 3-day history of left knee pain.  Pain which is of moderate severity started after she fell while ice skating.  Patient denies any significant swelling in the left knee.  No bruising in the left knee.  Pain is aggravated by bending her left knee and patient has some relief using the knee brace.  No numbness or tingling in the extremities.  Patient did not hit her head.  No loss of consciousness.  She is also complained of 2 episodes of sudden onset diaphoresis, nausea, feeling anxious and vomiting.  No fever or chills.  This is happened only a couple of times.  Patient has not taken any over-the-counter medication to help with pain.  HPI  Past Medical History:  Diagnosis Date   Abscess    Hidradenitis suppurativa 2008    Patient Active Problem List   Diagnosis Date Noted   Bilateral pendulous breasts 07/08/2020   Inclusion cyst 07/08/2020   Folliculitis 06/30/2019   Intertrigo 05/21/2019   Upper back pain 05/21/2019   Suppurative hidradenitis 04/05/2012    History reviewed. No pertinent surgical history.  OB History     Gravida  0   Para  0   Term  0   Preterm  0   AB  0   Living  0      SAB  0   IAB  0   Ectopic  0   Multiple  0   Live Births  0            Home Medications    Prior to Admission medications   Medication Sig Start Date End Date Taking? Authorizing Provider  ibuprofen (ADVIL) 600 MG tablet Take 1 tablet (600 mg total) by mouth every 6 (six) hours as needed. 04/04/21  Yes Khamari Yousuf, Britta Mccreedy, MD  albuterol (VENTOLIN HFA) 108 (90 Base) MCG/ACT inhaler Inhale 2 puffs into the lungs every 4 (four) hours as needed for wheezing or shortness of breath. 01/11/21   Cathlyn Parsons, NP  doxycycline  (VIBRA-TABS) 100 MG tablet Take 1 tablet (100 mg total) by mouth 2 (two) times daily. 09/24/20   Cirigliano, Mary K, DO  fluticasone (FLONASE) 50 MCG/ACT nasal spray Place 2 sprays into both nostrils daily. 01/11/21   Cathlyn Parsons, NP  metroNIDAZOLE (FLAGYL) 500 MG tablet Take 1 tablet (500 mg total) by mouth 2 (two) times daily. 02/17/21   Olivia Mackie, NP  Multiple Vitamin (MULTIVITAMIN) capsule Take 1 capsule by mouth daily.    [provider]    Family History Family History  Problem Relation Age of Onset   Diabetes Mother    Hypertension Father     Social History Social History   Tobacco Use   Smoking status: Never   Smokeless tobacco: Never  Vaping Use   Vaping Use: Never used  Substance Use Topics   Alcohol use: No   Drug use: Not Currently     Allergies   Patient has no known allergies.   Review of Systems Review of Systems  Musculoskeletal:  Positive for arthralgias and joint swelling. Negative for back pain, gait problem, myalgias, neck pain and neck stiffness.  Neurological: Negative.  Physical Exam Triage Vital Signs ED Triage Vitals  Enc Vitals Group     BP 04/04/21 1029 118/76     Pulse Rate 04/04/21 1029 79     Resp 04/04/21 1029 16     Temp 04/04/21 1029 98.7 F (37.1 C)     Temp Source 04/04/21 1029 Oral     SpO2 04/04/21 1029 100 %     Weight 04/04/21 1028 182 lb 15.7 oz (83 kg)     Height 04/04/21 1028 5\' 5"  (1.651 m)     Head Circumference --      Peak Flow --      Pain Score 04/04/21 1027 8     Pain Loc --      Pain Edu? --      Excl. in GC? --    No data found.  Updated Vital Signs BP 118/76 (BP Location: Left Arm)    Pulse 79    Temp 98.7 F (37.1 C) (Oral)    Resp 16    Ht 5\' 5"  (1.651 m)    Wt 83 kg    LMP 04/01/2021 (Exact Date)    SpO2 100%    BMI 30.45 kg/m   Visual Acuity Right Eye Distance:   Left Eye Distance:   Bilateral Distance:    Right Eye Near:   Left Eye Near:    Bilateral Near:      Physical Exam Vitals and nursing note reviewed.  Constitutional:      Appearance: Normal appearance. She is not ill-appearing.  Cardiovascular:     Rate and Rhythm: Normal rate and regular rhythm.     Pulses: Normal pulses.     Heart sounds: Normal heart sounds.  Musculoskeletal:        General: No swelling or tenderness. Normal range of motion.     Comments: No swelling or bruising.  Anterior and posterior drawer tests are negative.  Neurological:     Mental Status: She is alert.     UC Treatments / Results  Labs (all labs ordered are listed, but only abnormal results are displayed) Labs Reviewed - No data to display  EKG   Radiology No results found.  Procedures Procedures (including critical care time)  Medications Ordered in UC Medications - No data to display  Initial Impression / Assessment and Plan / UC Course  I have reviewed the triage vital signs and the nursing notes.  Pertinent labs & imaging results that were available during my care of the patient were reviewed by me and considered in my medical decision making (see chart for details).     1.  Left knee sprain: No indication for imaging at this time Gentle range of motion exercises Ibuprofen as needed for pain Continue knee brace use If pain persists you will need imaging of the knee Avoid skating for about a week to allow the knee to heal. Final Clinical Impressions(s) / UC Diagnoses   Final diagnoses:  Sprain of lateral collateral ligament of left knee, initial encounter     Discharge Instructions      Entered range of motion exercises Icing of the left knee Take medications as prescribed Return to urgent care if symptoms worsen.   ED Prescriptions     Medication Sig Dispense Auth. Provider   ibuprofen (ADVIL) 600 MG tablet Take 1 tablet (600 mg total) by mouth every 6 (six) hours as needed. 30 tablet Sofiah Lyne, , MD      PDMP not reviewed this  encounter.   Merrilee JanskyLamptey,  Andie Mortimer O, MD 04/04/21 [phone]

## 2021-04-07 ENCOUNTER — Ambulatory Visit (INDEPENDENT_AMBULATORY_CARE_PROVIDER_SITE_OTHER): Payer: BC Managed Care – PPO | Admitting: Nurse Practitioner

## 2021-04-07 ENCOUNTER — Other Ambulatory Visit: Payer: Self-pay

## 2021-04-07 ENCOUNTER — Encounter: Payer: Self-pay | Admitting: Nurse Practitioner

## 2021-04-07 ENCOUNTER — Ambulatory Visit (INDEPENDENT_AMBULATORY_CARE_PROVIDER_SITE_OTHER)
Admission: RE | Admit: 2021-04-07 | Discharge: 2021-04-07 | Disposition: A | Discharge status: Home / Self Care | Payer: BC Managed Care – PPO | Source: Ambulatory Visit | Attending: Nurse Practitioner | Admitting: Nurse Practitioner

## 2021-04-07 VITALS — BP 104/60 | HR 87 | Temp 98.7°F | Resp 14 | Ht 65.0 in | Wt 166.2 lb

## 2021-04-07 DIAGNOSIS — S8992XD Unspecified injury of left lower leg, subsequent encounter: Secondary | ICD-10-CM

## 2021-04-07 DIAGNOSIS — M25562 Pain in left knee: Secondary | ICD-10-CM | POA: Diagnosis not present

## 2021-04-07 DIAGNOSIS — S8992XA Unspecified injury of left lower leg, initial encounter: Secondary | ICD-10-CM | POA: Insufficient documentation

## 2021-04-07 NOTE — Progress Notes (Signed)
Acute Office Visit  Subjective:    Patient ID: Melissa Fisher, female    DOB: November 06, 1995, 26 y.o.   MRN: [id number]  Chief Complaint  Patient presents with   Knee Injury    Larey SeatFell while ice skating, landed incorrectly and injured her left knee on 04/02/21, popping is noticed in the knee and does not feel stable. She has been icing it, wearing knee brace, elevating it. Pain is present with movement/weight bearing not at rest. She did go to Urgent care on 04/04/21-no xrays were taking.    HPI Patient is in today for Knee injury  States that she was ice skating and she fell. When falling she states that she landed on her bottom and her right leg was out in front of her and her left leg was internally rotated and flexed. Got up immediately and then the next day she developed pain  Was seen on 04/04/2021 at urgent care got an exam but no xray. She was instructed to get a knee brace and was written some NSAIDs. She has been taking it as needed and it has helped   Clicking popping and hurting especially with climbing stairs and standing straight legged. States it feels better with slight flexion. No history of knee surgery on that side.   Past Medical History:  Diagnosis Date   Abscess    Hidradenitis suppurativa 2008    No past surgical history on file.  Family History  Problem Relation Age of Onset   Diabetes Mother    Hypertension Father     Social History   Socioeconomic History   Marital status: Single    Spouse name: Not on file   Number of children: Not on file   Years of education: Not on file   Highest education level: Not on file  Occupational History   Not on file  Tobacco Use   Smoking status: Never   Smokeless tobacco: Never  Vaping Use   Vaping Use: Never used  Substance and Sexual Activity   Alcohol use: No   Drug use: Not Currently   Sexual activity: Yes    Birth control/protection: Condom    Comment: intercourse age 26, less than 5 sexual partners   Other Topics Concern   Not on file  Social History Narrative   Not on file   Social Determinants of Health   Financial Resource Strain: Not on file  Food Insecurity: Not on file  Transportation Needs: Not on file  Physical Activity: Not on file  Stress: Not on file  Social Connections: Not on file  Intimate Partner Violence: Not on file    Outpatient Medications Prior to Visit  Medication Sig Dispense Refill   albuterol (VENTOLIN HFA) 108 (90 Base) MCG/ACT inhaler Inhale 2 puffs into the lungs every 4 (four) hours as needed for wheezing or shortness of breath. 1 each 1   clindamycin (CLINDAGEL) 1 % gel Apply 1 application topically daily.     fluticasone (FLONASE) 50 MCG/ACT nasal spray Place 2 sprays into both nostrils daily. 16 g 2   ibuprofen (ADVIL) 600 MG tablet Take 1 tablet (600 mg total) by mouth every 6 (six) hours as needed. 30 tablet 0   Multiple Vitamin (MULTIVITAMIN) capsule Take 1 capsule by mouth daily.     doxycycline (VIBRA-TABS) 100 MG tablet Take 1 tablet (100 mg total) by mouth 2 (two) times daily. 20 tablet 0   metroNIDAZOLE (FLAGYL) 500 MG tablet Take 1 tablet (500 mg total)  by mouth 2 (two) times daily. 14 tablet 0   No facility-administered medications prior to visit.    No Known Allergies  Review of Systems  Constitutional:  Negative for chills and fever.  Musculoskeletal:  Positive for arthralgias and joint swelling.  Neurological:  Negative for weakness and numbness.      Objective:    Physical Exam Vitals and nursing note reviewed.  Constitutional:      Appearance: Normal appearance.  Cardiovascular:     Rate and Rhythm: Normal rate and regular rhythm.     Pulses:          Posterior tibial pulses are 2+ on the right side and 2+ on the left side.     Heart sounds: Normal heart sounds.  Pulmonary:     Effort: Pulmonary effort is normal.     Breath sounds: Normal breath sounds.  Abdominal:     General: Bowel sounds are normal.   Musculoskeletal:     Right knee: Normal. Normal pulse.     Left knee: Bony tenderness present. No crepitus. Normal range of motion. Tenderness present. Normal alignment and normal meniscus. Normal pulse.       Legs:  Skin:    General: Skin is warm.  Neurological:     General: No focal deficit present.     Mental Status: She is alert.     Motor: No weakness.     Deep Tendon Reflexes: Reflexes normal.     Comments: Bilateral lower extremity strength 5/5 No break through weakness but patient was timid with certain movements because of the fear of pain    BP 104/60    Pulse 87    Temp 98.7 F (37.1 C)    Resp 14    Ht 5\' 5"  (1.651 m)    Wt 166 lb 4 oz (75.4 kg)    LMP 04/03/2021 (Exact Date)    SpO2 96%    BMI 27.67 kg/m  Wt Readings from Last 3 Encounters:  04/07/21 166 lb 4 oz (75.4 kg)  04/04/21 182 lb 15.7 oz (83 kg)  07/08/20 183 lb (83 kg)    Health Maintenance Due  Topic Date Due   HPV VACCINES (1 - 2-dose series) Never done   Hepatitis C Screening  Never done   PAP SMEAR-Modifier  Never done   COVID-19 Vaccine (5 - Booster for Pfizer series) 03/21/2020       Topic Date Due   HPV VACCINES (1 - 2-dose series) Never done     No results found for: TSH Lab Results  Component Value Date   WBC 5.1 03/18/2019   HGB 13.5 03/18/2019   HCT 38.7 03/18/2019   MCV 94.1 03/18/2019   PLT 347.0 03/18/2019   Lab Results  Component Value Date   NA 136 03/18/2019   K 3.6 03/18/2019   CO2 27 03/18/2019   GLUCOSE 91 03/18/2019   BUN 16 03/18/2019   CREATININE 0.88 03/18/2019   BILITOT 0.8 03/18/2019   ALKPHOS 53 03/18/2019   AST 15 03/18/2019   ALT 10 03/18/2019   PROT 7.1 03/18/2019   ALBUMIN 4.3 03/18/2019   CALCIUM 9.4 03/18/2019   GFR 96.08 03/18/2019   Lab Results  Component Value Date   CHOL 155 03/18/2019   Lab Results  Component Value Date   HDL 45.30 03/18/2019   Lab Results  Component Value Date   LDLCALC 100 (H) 03/18/2019   Lab Results   Component Value Date   TRIG 49.0  03/18/2019   Lab Results  Component Value Date   CHOLHDL 3 03/18/2019   No results found for: HGBA1C     Assessment & Plan:   Problem List Items Addressed This Visit       Other   Injury of left knee    Patient has been working on skating and landed inappropriately when she fell.  Was evaluated on 04/04/2021 in urgent care and given ibuprofen and told to get a knee brace follow-up if no improvement.  Patient is having some improvement with the knee pain but is having some clicking popping and feelings of possible giving out.  Patient requesting x-ray in office will oblige.  Pending x-ray result      Relevant Orders   DG Knee Complete 4 Views Left   Acute pain of left knee - Primary    Patient to continue abstaining from skating while he heals.  Did encourage her to continue using the knee brace/sleeve for stability and continue taking ibuprofen as needed and prescribed.  Patient continue using ice or heat whichever writes her feel better.  Continue to monitor symptoms for like it she will resolve with rest as its improving already.  Pending x-ray results      Relevant Orders   DG Knee Complete 4 Views Left     No orders of the defined types were placed in this encounter.  This visit occurred during the SARS-CoV-2 public health emergency.  Safety protocols were in place, including screening questions prior to the visit, additional usage of staff PPE, and extensive cleaning of exam room while observing appropriate contact time as indicated for disinfecting solutions.    Audria Nine, NP

## 2021-04-07 NOTE — Assessment & Plan Note (Signed)
Patient to continue abstaining from skating while he heals.  Did encourage her to continue using the knee brace/sleeve for stability and continue taking ibuprofen as needed and prescribed.  Patient continue using ice or heat whichever writes her feel better.  Continue to monitor symptoms for like it she will resolve with rest as its improving already.  Pending x-ray results

## 2021-04-07 NOTE — Assessment & Plan Note (Addendum)
Patient has been working on skating and landed inappropriately when she fell.  Was evaluated on 04/04/2021 in urgent care and given ibuprofen and told to get a knee brace follow-up if no improvement.  Patient is having some improvement with the knee pain but is having some clicking popping and feelings of possible giving out.  Patient requesting x-ray in office will oblige.  Pending x-ray result

## 2021-04-07 NOTE — Patient Instructions (Signed)
Nice to see you today I will be in touch with regards to your xray results Follow up if no improvement Continue taking the ibuprofen as needed

## 2021-04-08 ENCOUNTER — Ambulatory Visit (HOSPITAL_COMMUNITY): Payer: Self-pay

## 2021-05-02 ENCOUNTER — Telehealth: Payer: Self-pay | Admitting: *Deleted

## 2021-05-02 ENCOUNTER — Encounter: Payer: Self-pay | Admitting: Nurse Practitioner

## 2021-05-02 ENCOUNTER — Ambulatory Visit (INDEPENDENT_AMBULATORY_CARE_PROVIDER_SITE_OTHER): Payer: BC Managed Care – PPO | Admitting: Nurse Practitioner

## 2021-05-02 ENCOUNTER — Other Ambulatory Visit: Payer: Self-pay

## 2021-05-02 VITALS — BP 110/74 | Ht 66.0 in | Wt 170.0 lb

## 2021-05-02 DIAGNOSIS — Z01419 Encounter for gynecological examination (general) (routine) without abnormal findings: Secondary | ICD-10-CM | POA: Diagnosis not present

## 2021-05-02 DIAGNOSIS — N6313 Unspecified lump in the right breast, lower outer quadrant: Secondary | ICD-10-CM | POA: Diagnosis not present

## 2021-05-02 NOTE — Progress Notes (Signed)
° °  Melissa Fisher 06/03/1995 [id number]   History:  26 y.o. G0 presents for annual exam without GYN complaints. Monthly cycle. Normal pap history. Gardasil series completed. History of hydradenitis suppurativa managed with weight loss (down 35 pounds), diet, and antibiotic ointments as needed - followed by dermatology. + Chlamydia 06/2020.   Gynecologic History Patient's last menstrual period was 03/31/2021 (exact date). Period Cycle (Days): 28 Period Duration (Days): 7 Period Pattern: Regular Menstrual Flow: Heavy Dysmenorrhea: (!) Moderate Dysmenorrhea Symptoms: Cramping Contraception/Family planning: condoms Sexually active: Yes, declines STD screening  Health Maintenance Last Pap: 04/23/2019. Results were: Normal Last mammogram: N/A Last colonoscopy: N/A  Last Dexa: N/A  Past medical history, past surgical history, family history and social history were all reviewed and documented in the EPIC chart. 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  ROS:  A ROS was performed and pertinent positives and negatives are included.  Exam:  Vitals:   05/02/21 0904  BP: 110/74  Weight: 170 lb (77.1 kg)  Height: 5\' 6"  (1.676 m)    Body mass index is 27.44 kg/m.  General appearance:  Normal Thyroid:  Symmetrical, normal in size, without palpable masses or nodularity. Respiratory  Auscultation:  Clear without wheezing or rhonchi Cardiovascular  Auscultation:  Regular rate, without rubs, murmurs or gallops  Edema/varicosities:  Not grossly evident Abdominal  Soft,nontender, without masses, guarding or rebound.  Liver/spleen:  No organomegaly noted  Hernia:  None appreciated  Skin  Inspection:  Grossly normal. Scarring on breasts and groin/thighs from HS Breasts: Examined lying and sitting.   Right: Mass at 8 o'clock position about 3 cm from nipple, mobile, non-tender, 3 mm in size. No retractions,discharge or axillary adenopathy.  Left: Without masses,  retractions, discharge or axillary adenopathy. Genitourinary Not indicated. On menses  Digital rectal exam: Not indicated  Patient informed chaperone available to be present for breast and pelvic exam. Patient has requested no chaperone to be present. Patient has been advised what will be completed during breast and pelvic exam.   Assessment/Plan:  26 y.o. G0 for annual exam.   Well female exam with routine gynecological exam - Education provided on SBEs, importance of preventative screenings, current guidelines, high calcium diet, regular exercise, safe sex, and multivitamin daily.   Breast lump on right side at 8 o'clock position - about 3 cm from nipple, mobile, non-tender, 3 mm in size. Paternal aunt with history of breast cancer, otherwise no significant history. Will send referral for diagnostic mammogram.   Screening for cervical cancer - Normal pap history. Will repeat at 3-year interval.  Follow up in 1 year for annual.     [facility], 9:21 AM 05/02/2021

## 2021-05-02 NOTE — Telephone Encounter (Signed)
-----   Message from Olivia Mackie, NP sent at 05/02/2021  9:26 AM EST ----- Regarding: Diagnostic mammogram Please send referral for diagnostic mammogram of right breast for mass at 8 o'clock position. Thanks.

## 2021-05-02 NOTE — Telephone Encounter (Signed)
Patient scheduled at the breast center on 05/18/21 @ 1:30pm

## 2021-05-05 NOTE — Telephone Encounter (Signed)
I called patient and left message on voicemail that I sent her a my chart message to read regarding appointment.  ?

## 2021-05-18 ENCOUNTER — Other Ambulatory Visit: Payer: Self-pay | Admitting: Nurse Practitioner

## 2021-05-18 ENCOUNTER — Ambulatory Visit
Admission: RE | Admit: 2021-05-18 | Discharge: 2021-05-18 | Disposition: A | Discharge status: Home / Self Care | Payer: BC Managed Care – PPO | Source: Ambulatory Visit | Attending: Nurse Practitioner | Admitting: Nurse Practitioner

## 2021-05-18 DIAGNOSIS — N6313 Unspecified lump in the right breast, lower outer quadrant: Secondary | ICD-10-CM

## 2021-10-07 ENCOUNTER — Ambulatory Visit (HOSPITAL_COMMUNITY): Payer: Self-pay

## 2021-10-07 ENCOUNTER — Ambulatory Visit (HOSPITAL_COMMUNITY)
Admission: RE | Admit: 2021-10-07 | Discharge: 2021-10-07 | Disposition: A | Discharge status: Home / Self Care | Payer: BC Managed Care – PPO | Source: Ambulatory Visit | Attending: Internal Medicine | Admitting: Internal Medicine

## 2021-10-07 VITALS — BP 107/70 | HR 74 | Temp 98.4°F | Resp 18

## 2021-10-07 DIAGNOSIS — L732 Hidradenitis suppurativa: Secondary | ICD-10-CM

## 2021-10-07 DIAGNOSIS — L02411 Cutaneous abscess of right axilla: Secondary | ICD-10-CM

## 2021-10-07 MED ORDER — DOXYCYCLINE HYCLATE 100 MG PO CAPS: 100.0000 mg | ORAL_CAPSULE | Freq: Two times a day (BID) | ORAL | 0 refills | Status: AC

## 2021-10-07 MED ORDER — IBUPROFEN 600 MG PO TABS: 600.0000 mg | ORAL_TABLET | Freq: Four times a day (QID) | ORAL | 0 refills | Status: DC | PRN

## 2021-10-07 MED ORDER — DOXYCYCLINE HYCLATE 100 MG PO CAPS: 100.0000 mg | ORAL_CAPSULE | Freq: Two times a day (BID) | ORAL | 0 refills | Status: DC

## 2021-10-07 MED ORDER — IBUPROFEN 600 MG PO TABS: 600.0000 mg | ORAL_TABLET | Freq: Four times a day (QID) | ORAL | 0 refills | Status: AC | PRN

## 2021-10-07 NOTE — ED Provider Notes (Signed)
[facility]    CSN: [id number] Arrival date & time: 10/07/21  1512      History   Chief Complaint Chief Complaint  Patient presents with   Abscess    hidradenitis suppurativa Swelling under right arm pit - Entered by patient    HPI Melissa Fisher is a 26 y.o. female.   Patient presents to urgent care for evaluation of hidradenitis suppurativa flareup.  She states that she first noticed some swelling and abscess formation to her right axillary region 2 days ago that has grown in size over the last couple of days.  Area is very tender to the touch and painful with movement of the right upper extremity.  Pain is currently a 6 on a scale of 0-10 to the right armpit.  Abscess is not draining, not very red, and slightly warm at this time.  Patient states that she gets HS flareups approximately every 3 months.  She has had HS for the last 15 years and states that her illnesses well controlled with diet, exercise, and lifestyle management.  She states that whenever a lesion pops up, it does not take long for it to become very angry and irritated.  She has not had a flare up in the last 2-3 months. She has not attempted use of any over the counter medications or interventions prior to arrival to urgent care.      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  OB History     Gravida  0   Para  0   Term  0   Preterm  0   AB  0   Living  0      SAB  0   IAB  0   Ectopic  0   Multiple  0   Live Births  0            Home Medications    Prior to Admission  medications   Medication Sig Start Date End Date Taking? Authorizing Provider  albuterol (VENTOLIN HFA) 108 (90 Base) MCG/ACT inhaler Inhale 2 puffs into the lungs every 4 (four) hours as needed for wheezing or shortness of breath. 01/11/21   Cathlyn Parsons, NP  clindamycin (CLINDAGEL) 1 % gel Apply 1 application topically daily. 02/01/21   [provider]  doxycycline (VIBRAMYCIN) 100 MG capsule Take 1 capsule (100 mg total) by mouth 2 (two) times daily. 10/07/21   Carlisle Beers, FNP  fluticasone (FLONASE) 50 MCG/ACT nasal spray Place 2 sprays into both nostrils daily. 01/11/21   Cathlyn Parsons, NP  ibuprofen (ADVIL) 600 MG tablet Take 1 tablet (600 mg total) by mouth every 6 (six) hours as needed. 10/07/21   Carlisle Beers, FNP  Multiple Vitamin (MULTIVITAMIN) capsule Take 1 capsule by mouth daily.    [provider]    Family History Family History  Problem Relation Age of Onset   Diabetes Mother    Hypertension Father    Dementia Father     Social History Social History  Tobacco Use   Smoking status: Never   Smokeless tobacco: Never  Vaping Use   Vaping Use: Never used  Substance Use Topics   Alcohol use: No   Drug use: Not Currently    Types: Marijuana    Comment: Rare     Allergies   Patient has no known allergies.   Review of Systems Review of Systems Per HPI  Physical Exam Triage Vital Signs ED Triage Vitals  Enc Vitals Group     BP 10/07/21 1535 107/70     Pulse Rate 10/07/21 1535 74     Resp 10/07/21 1535 18     Temp 10/07/21 1535 98.4 F (36.9 C)     Temp src --      SpO2 10/07/21 1535 97 %     Weight --      Height --      Head Circumference --      Peak Flow --      Pain Score 10/07/21 1539 3     Pain Loc --      Pain Edu? --      Excl. in GC? --    No data found.  Updated Vital Signs BP 107/70   Pulse 74   Temp 98.4 F (36.9 C)   Resp 18   SpO2 97%   Visual Acuity Right Eye Distance:   Left Eye  Distance:   Bilateral Distance:    Right Eye Near:   Left Eye Near:    Bilateral Near:     Physical Exam Vitals and nursing note reviewed.  Constitutional:      Appearance: Normal appearance. She is not ill-appearing or toxic-appearing.     Comments: Very pleasant patient sitting on exam in position of comfort table in no acute distress.   HENT:     Head: Normocephalic and atraumatic.     Right Ear: Hearing and external ear normal.     Left Ear: Hearing and external ear normal.     Nose: Nose normal.     Mouth/Throat:     Lips: Pink.     Mouth: Mucous membranes are moist.  Eyes:     General: Lids are normal. Vision grossly intact. Gaze aligned appropriately.     Extraocular Movements: Extraocular movements intact.     Conjunctiva/sclera: Conjunctivae normal.  Pulmonary:     Effort: Pulmonary effort is normal.  Abdominal:     Palpations: Abdomen is soft.  Musculoskeletal:     Cervical back: Neck supple.  Skin:    General: Skin is warm and dry.     Capillary Refill: Capillary refill takes less than 2 seconds.     Findings: Lesion present. No rash.     Comments: Lesion to the right axillary region that is consistent with HS. See image below for further detail. No warmth, erythema, or drainage from lesion. No axillary lymph node swelling to palpation. Normal range of motion to the right upper extremity present without numbness/tingling to the right upper extremity. +2 radial pulses bilaterally.   Neurological:     General: No focal deficit present.     Mental Status: She is alert and oriented to person, place, and time. Mental status is at baseline.     Cranial Nerves: No dysarthria or facial asymmetry.     Gait: Gait is intact.  Psychiatric:        Mood and Affect: Mood normal.        Speech: Speech normal.  Behavior: Behavior normal.        Thought Content: Thought content normal.        Judgment: Judgment normal.         UC Treatments / Results  Labs (all  labs ordered are listed, but only abnormal results are displayed) Labs Reviewed - No data to display  EKG   Radiology No results found.  Procedures Procedures (including critical care time)  Medications Ordered in UC Medications - No data to display  Initial Impression / Assessment and Plan / UC Course  I have reviewed the triage vital signs and the nursing notes.  Pertinent labs & imaging results that were available during my care of the patient were reviewed by me and considered in my medical decision making (see chart for details).   1.  Hidradenitis suppurativa flareup to the right axillary region Doxycycline twice daily for the next 10 days prescribed to treat HS flareup.  Warm compresses advised to reduce swelling and infection to the area.  Ibuprofen 600 mg every 6 hours as needed with food may be used for pain and inflammation to the area.  Offered clindamycin gel/ointment and patient declined stating that this no longer works for her.  Patient can follow-up with her dermatologist for ongoing management of her HS as well as PCP.   Discussed physical exam and available lab work findings in clinic with patient.  Counseled patient regarding appropriate use of medications and potential side effects for all medications recommended or prescribed today. Discussed red flag signs and symptoms of worsening condition,when to call the PCP office, return to urgent care, and when to seek higher level of care in the emergency department. Patient verbalizes understanding and agreement with plan. All questions answered. Patient discharged in stable condition.  Final Clinical Impressions(s) / UC Diagnoses   Final diagnoses:  Hidradenitis suppurativa  Abscess of axilla, right     Discharge Instructions      Take doxycycline antibiotic twice daily for the next 10 days to treat your hidradenitis suppurativa flare-up.  Apply warm compresses to your right armpit to help reduce the swelling and  infection in the area. You may take ibuprofen 600 mg every 6 hours as needed with food for any pain that you may have.   If you develop any new or worsening symptoms or do not improve in the next 2 to 3 days, please return.  If your symptoms are severe, please go to the emergency room.  Follow-up with your primary care provider for further evaluation and management of your symptoms as well as ongoing wellness visits.  I hope you feel better!    ED Prescriptions     Medication Sig Dispense Auth. Provider   doxycycline (VIBRAMYCIN) 100 MG capsule  (Status: Discontinued) Take 1 capsule (100 mg total) by mouth 2 (two) times daily. 20 capsule Reita May M, FNP   ibuprofen (ADVIL) 600 MG tablet  (Status: Discontinued) Take 1 tablet (600 mg total) by mouth every 6 (six) hours as needed. 30 tablet Carlisle Beers, FNP   doxycycline (VIBRAMYCIN) 100 MG capsule Take 1 capsule (100 mg total) by mouth 2 (two) times daily. 20 capsule Reita May M, FNP   ibuprofen (ADVIL) 600 MG tablet Take 1 tablet (600 mg total) by mouth every 6 (six) hours as needed. 30 tablet Carlisle Beers, FNP      PDMP not reviewed this encounter.   Carlisle Beers, [place] 10/07/21 [phone]

## 2021-10-07 NOTE — Discharge Instructions (Addendum)
Take doxycycline antibiotic twice daily for the next 10 days to treat your hidradenitis suppurativa flare-up.  Apply warm compresses to your right armpit to help reduce the swelling and infection in the area. You may take ibuprofen 600 mg every 6 hours as needed with food for any pain that you may have.   If you develop any new or worsening symptoms or do not improve in the next 2 to 3 days, please return.  If your symptoms are severe, please go to the emergency room.  Follow-up with your primary care provider for further evaluation and management of your symptoms as well as ongoing wellness visits.  I hope you feel better!

## 2021-10-07 NOTE — ED Triage Notes (Signed)
Pt presents to [facility] with co of hs flair up in right arm pit.

## 2021-10-17 ENCOUNTER — Ambulatory Visit (HOSPITAL_COMMUNITY): Payer: Self-pay

## 2021-11-18 ENCOUNTER — Other Ambulatory Visit: Payer: Self-pay | Admitting: Nurse Practitioner

## 2021-11-18 ENCOUNTER — Ambulatory Visit
Admission: RE | Admit: 2021-11-18 | Discharge: 2021-11-18 | Disposition: A | Discharge status: Home / Self Care | Payer: BC Managed Care – PPO | Source: Ambulatory Visit | Attending: Nurse Practitioner | Admitting: Nurse Practitioner

## 2021-11-18 ENCOUNTER — Other Ambulatory Visit: Payer: BC Managed Care – PPO

## 2021-11-18 DIAGNOSIS — N6313 Unspecified lump in the right breast, lower outer quadrant: Secondary | ICD-10-CM

## 2022-05-03 ENCOUNTER — Ambulatory Visit: Payer: BC Managed Care – PPO | Admitting: Nurse Practitioner

## 2022-05-22 ENCOUNTER — Other Ambulatory Visit: Payer: BC Managed Care – PPO

## 2022-06-16 ENCOUNTER — Ambulatory Visit
Admission: RE | Admit: 2022-06-16 | Discharge: 2022-06-16 | Disposition: A | Discharge status: Home / Self Care | Payer: BC Managed Care – PPO | Source: Ambulatory Visit | Attending: Nurse Practitioner | Admitting: Nurse Practitioner

## 2022-06-16 DIAGNOSIS — N6313 Unspecified lump in the right breast, lower outer quadrant: Secondary | ICD-10-CM

## 2023-01-18 ENCOUNTER — Telehealth: Payer: Self-pay | Admitting: Family Medicine

## 2023-01-18 NOTE — Telephone Encounter (Signed)
ERROR

## 2023-08-04 IMAGING — DX DG KNEE COMPLETE 4+V*L*
5 series · 5 of 5 positions shown · non-contrast
Comparison: None.

CLINICAL DATA: LEFT knee injury and pain.  Initial encounter.

EXAM:
LEFT KNEE - COMPLETE 4+ VIEW

[knee ap (1 of 3)]
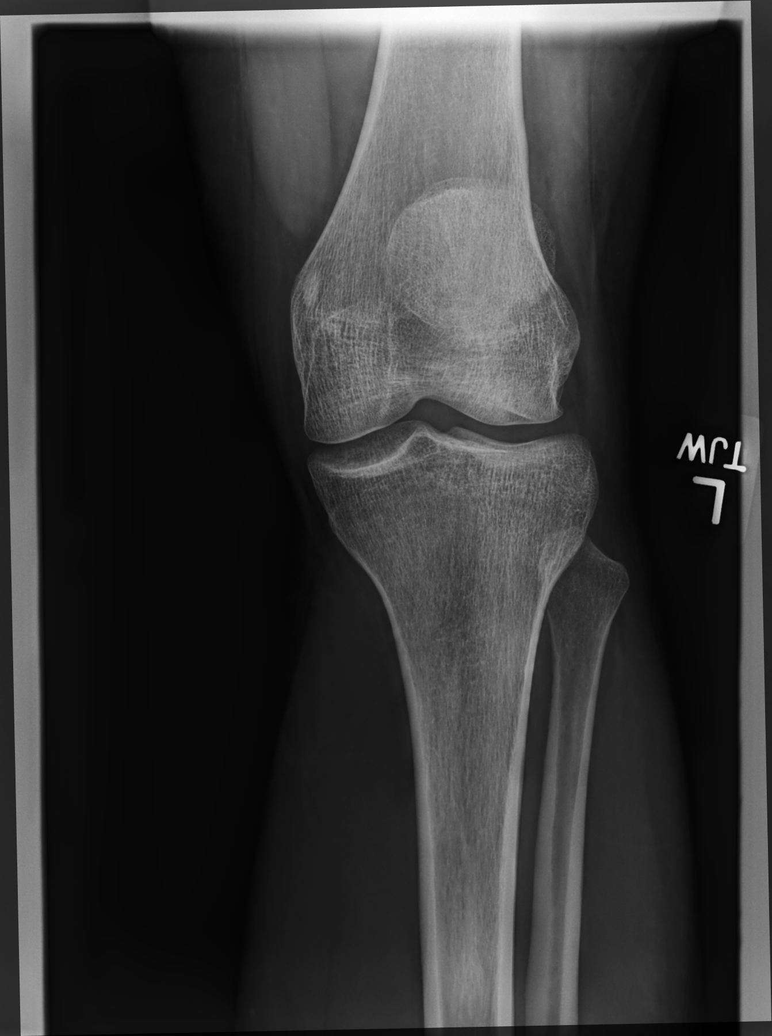

[knee ap (2 of 3)]
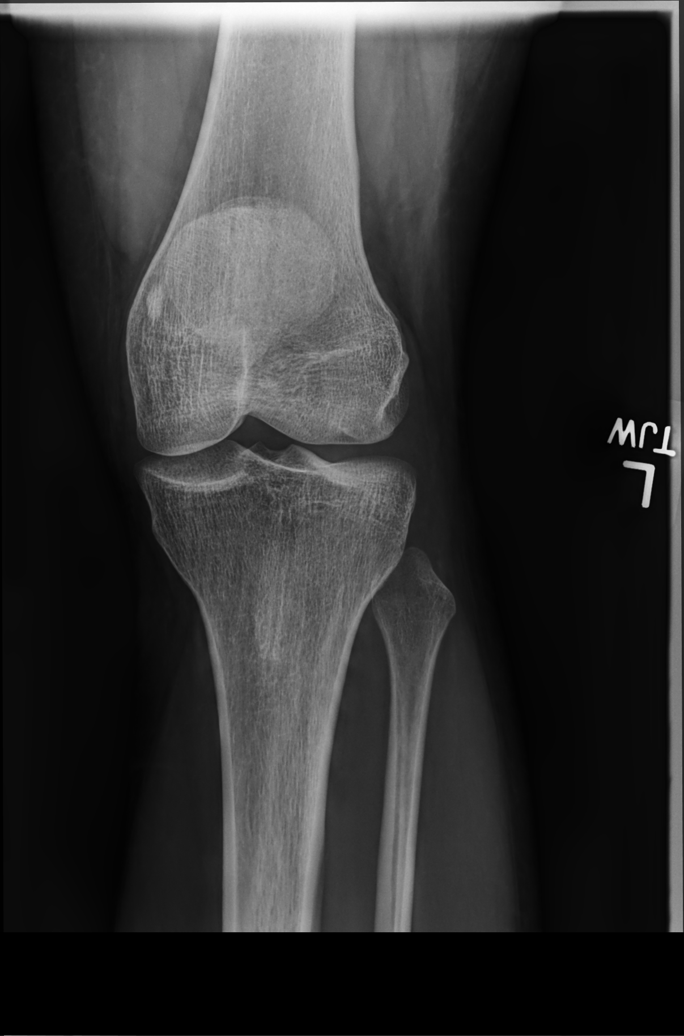

[knee lat]
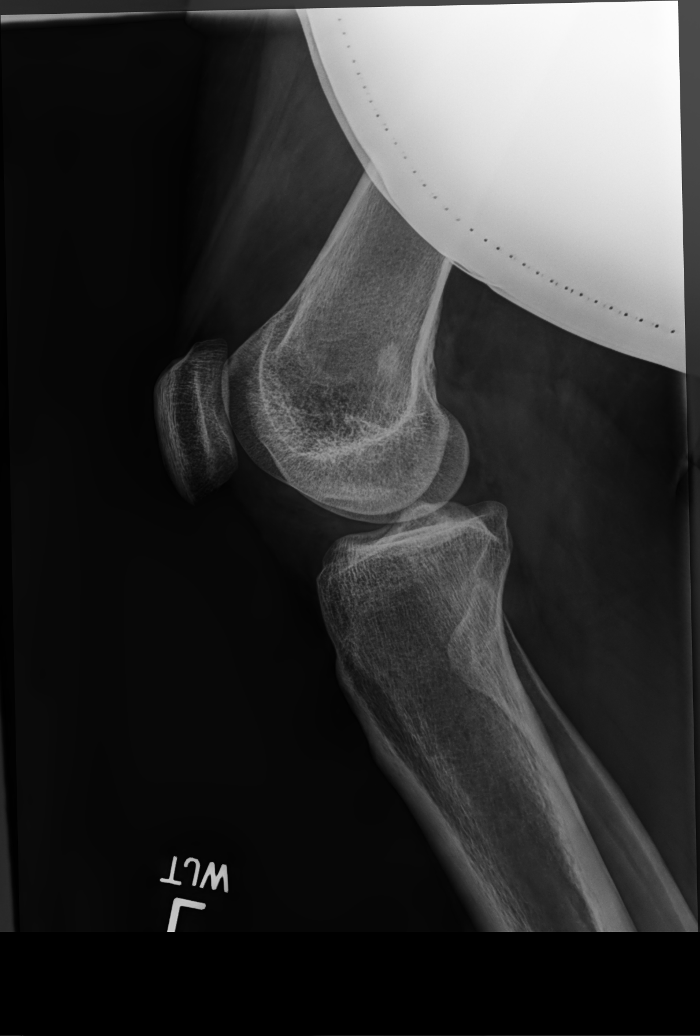

[patella (sunrise)]
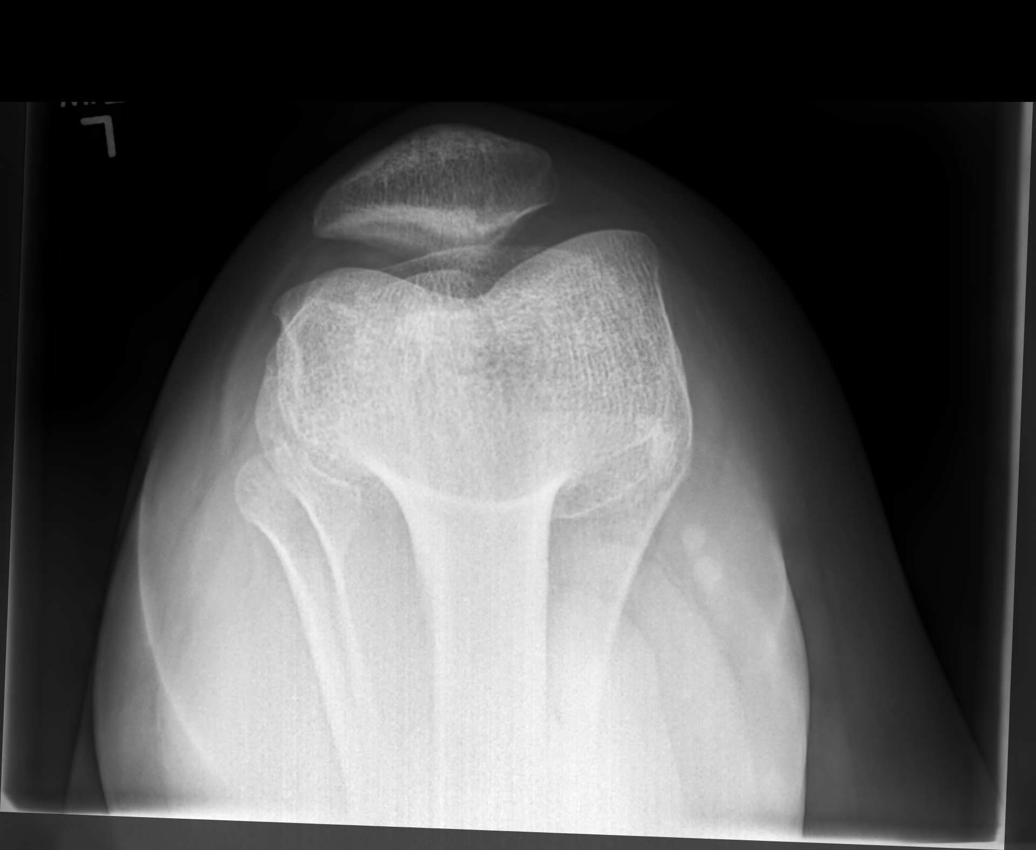

[knee ap (3 of 3)]
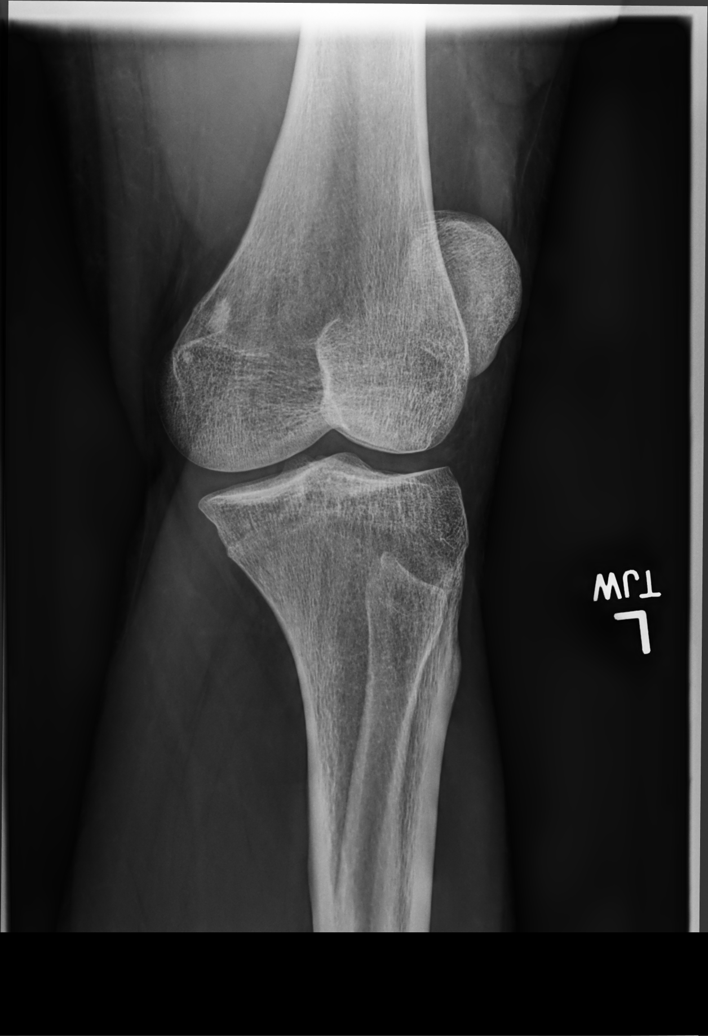

[5 of 5 positions shown; findings below may reference images not displayed]

FINDINGS: No evidence of fracture, dislocation, or joint effusion. No evidence
of arthropathy or significant focal bone abnormality. Soft tissues
are unremarkable.
IMPRESSION: Negative.
